# Patient Record
Sex: Male | Born: 1950 | Race: Black or African American | Hispanic: No | Smoking: Former smoker
Health system: Southern US, Community
[De-identification: ages and names within clinical notes are randomized; demographics above are authoritative.]

## PROBLEM LIST (undated history)

## (undated) DIAGNOSIS — F319 Bipolar disorder, unspecified: Secondary | ICD-10-CM

## (undated) DIAGNOSIS — I639 Cerebral infarction, unspecified: Secondary | ICD-10-CM

## (undated) DIAGNOSIS — IMO0001 Reserved for inherently not codable concepts without codable children: Secondary | ICD-10-CM

## (undated) DIAGNOSIS — Z5189 Encounter for other specified aftercare: Secondary | ICD-10-CM

## (undated) HISTORY — PX: KNEE ARTHROSCOPY: SHX127

## (undated) HISTORY — PX: CORONARY ARTERY BYPASS GRAFT: SHX141

---

## 2000-12-01 ENCOUNTER — Inpatient Hospital Stay (HOSPITAL_COMMUNITY): Admission: EM | Admit: 2000-12-01 | Discharge: 2000-12-07 | Payer: Self-pay

## 2000-12-05 ENCOUNTER — Encounter: Payer: Self-pay | Admitting: General Surgery

## 2000-12-06 ENCOUNTER — Encounter: Payer: Self-pay | Admitting: General Surgery

## 2000-12-07 ENCOUNTER — Inpatient Hospital Stay
Admission: RE | Admit: 2000-12-07 | Discharge: 2000-12-28 | Payer: Self-pay | Admitting: Physical Medicine & Rehabilitation

## 2000-12-08 ENCOUNTER — Encounter: Payer: Self-pay | Admitting: Physical Medicine and Rehabilitation

## 2000-12-17 ENCOUNTER — Encounter: Payer: Self-pay | Admitting: Physical Medicine & Rehabilitation

## 2000-12-17 ENCOUNTER — Ambulatory Visit (HOSPITAL_COMMUNITY)
Admission: RE | Admit: 2000-12-17 | Discharge: 2000-12-17 | Payer: Self-pay | Admitting: Physical Medicine & Rehabilitation

## 2000-12-24 ENCOUNTER — Ambulatory Visit (HOSPITAL_COMMUNITY)
Admission: RE | Admit: 2000-12-24 | Discharge: 2000-12-24 | Payer: Self-pay | Admitting: Physical Medicine & Rehabilitation

## 2000-12-24 ENCOUNTER — Encounter: Payer: Self-pay | Admitting: Specialist

## 2000-12-28 ENCOUNTER — Inpatient Hospital Stay (HOSPITAL_COMMUNITY)
Admission: RE | Admit: 2000-12-28 | Discharge: 2001-01-02 | Payer: Self-pay | Admitting: Physical Medicine & Rehabilitation

## 2001-02-13 ENCOUNTER — Encounter (HOSPITAL_COMMUNITY): Admission: RE | Admit: 2001-02-13 | Discharge: 2001-03-15 | Payer: Self-pay | Admitting: *Deleted

## 2001-03-18 ENCOUNTER — Encounter (HOSPITAL_COMMUNITY): Admission: RE | Admit: 2001-03-18 | Discharge: 2001-04-17 | Payer: Self-pay | Admitting: *Deleted

## 2001-04-19 ENCOUNTER — Encounter (HOSPITAL_COMMUNITY): Admission: RE | Admit: 2001-04-19 | Discharge: 2001-05-19 | Payer: Self-pay | Admitting: *Deleted

## 2001-05-20 ENCOUNTER — Encounter (HOSPITAL_COMMUNITY): Admission: RE | Admit: 2001-05-20 | Discharge: 2001-06-19 | Payer: Self-pay | Admitting: *Deleted

## 2002-11-23 ENCOUNTER — Emergency Department (HOSPITAL_COMMUNITY): Admission: EM | Admit: 2002-11-23 | Discharge: 2002-11-23 | Payer: Self-pay | Admitting: Emergency Medicine

## 2002-11-26 ENCOUNTER — Inpatient Hospital Stay (HOSPITAL_COMMUNITY): Admission: EM | Admit: 2002-11-26 | Discharge: 2002-12-05 | Payer: Self-pay | Admitting: Psychiatry

## 2003-01-24 ENCOUNTER — Emergency Department (HOSPITAL_COMMUNITY): Admission: EM | Admit: 2003-01-24 | Discharge: 2003-01-24 | Payer: Self-pay | Admitting: *Deleted

## 2003-07-28 ENCOUNTER — Ambulatory Visit (HOSPITAL_COMMUNITY): Admission: RE | Admit: 2003-07-28 | Discharge: 2003-07-28 | Payer: Self-pay | Admitting: General Surgery

## 2004-01-21 ENCOUNTER — Ambulatory Visit (HOSPITAL_COMMUNITY): Admission: RE | Admit: 2004-01-21 | Discharge: 2004-01-21 | Payer: Self-pay | Admitting: General Surgery

## 2004-04-03 ENCOUNTER — Emergency Department (HOSPITAL_COMMUNITY): Admission: EM | Admit: 2004-04-03 | Discharge: 2004-04-03 | Payer: Self-pay | Admitting: Emergency Medicine

## 2004-11-02 ENCOUNTER — Emergency Department (HOSPITAL_COMMUNITY): Admission: EM | Admit: 2004-11-02 | Discharge: 2004-11-02 | Payer: Self-pay | Admitting: Emergency Medicine

## 2004-11-03 ENCOUNTER — Emergency Department (HOSPITAL_COMMUNITY): Admission: EM | Admit: 2004-11-03 | Discharge: 2004-11-03 | Payer: Self-pay | Admitting: Emergency Medicine

## 2005-10-19 ENCOUNTER — Ambulatory Visit (HOSPITAL_COMMUNITY): Admission: RE | Admit: 2005-10-19 | Discharge: 2005-10-19 | Payer: Self-pay | Admitting: Family Medicine

## 2006-12-12 ENCOUNTER — Emergency Department (HOSPITAL_COMMUNITY): Admission: EM | Admit: 2006-12-12 | Discharge: 2006-12-12 | Payer: Self-pay | Admitting: Emergency Medicine

## 2007-04-25 ENCOUNTER — Emergency Department (HOSPITAL_COMMUNITY): Admission: EM | Admit: 2007-04-25 | Discharge: 2007-04-25 | Payer: Self-pay | Admitting: Emergency Medicine

## 2007-09-02 ENCOUNTER — Emergency Department (HOSPITAL_COMMUNITY): Admission: EM | Admit: 2007-09-02 | Discharge: 2007-09-02 | Payer: Self-pay | Admitting: Emergency Medicine

## 2007-09-20 ENCOUNTER — Emergency Department (HOSPITAL_COMMUNITY): Admission: EM | Admit: 2007-09-20 | Discharge: 2007-09-20 | Payer: Self-pay | Admitting: Emergency Medicine

## 2007-09-22 ENCOUNTER — Emergency Department (HOSPITAL_COMMUNITY): Admission: EM | Admit: 2007-09-22 | Discharge: 2007-09-22 | Payer: Self-pay | Admitting: Emergency Medicine

## 2007-10-03 ENCOUNTER — Emergency Department (HOSPITAL_COMMUNITY): Admission: EM | Admit: 2007-10-03 | Discharge: 2007-10-03 | Payer: Self-pay | Admitting: Emergency Medicine

## 2007-10-11 ENCOUNTER — Emergency Department (HOSPITAL_COMMUNITY): Admission: EM | Admit: 2007-10-11 | Discharge: 2007-10-11 | Payer: Self-pay | Admitting: Emergency Medicine

## 2007-10-15 ENCOUNTER — Emergency Department (HOSPITAL_COMMUNITY): Admission: EM | Admit: 2007-10-15 | Discharge: 2007-10-15 | Payer: Self-pay | Admitting: Emergency Medicine

## 2007-10-19 ENCOUNTER — Emergency Department (HOSPITAL_COMMUNITY): Admission: EM | Admit: 2007-10-19 | Discharge: 2007-10-19 | Payer: Self-pay | Admitting: Emergency Medicine

## 2007-10-23 ENCOUNTER — Emergency Department (HOSPITAL_COMMUNITY): Admission: EM | Admit: 2007-10-23 | Discharge: 2007-10-23 | Payer: Self-pay | Admitting: Emergency Medicine

## 2007-10-30 ENCOUNTER — Emergency Department (HOSPITAL_COMMUNITY): Admission: EM | Admit: 2007-10-30 | Discharge: 2007-10-30 | Payer: Self-pay | Admitting: Emergency Medicine

## 2007-11-17 ENCOUNTER — Emergency Department (HOSPITAL_COMMUNITY): Admission: EM | Admit: 2007-11-17 | Discharge: 2007-11-17 | Payer: Self-pay | Admitting: Emergency Medicine

## 2007-12-09 ENCOUNTER — Ambulatory Visit: Payer: Self-pay | Admitting: Family Medicine

## 2007-12-09 DIAGNOSIS — N318 Other neuromuscular dysfunction of bladder: Secondary | ICD-10-CM

## 2007-12-09 DIAGNOSIS — F17200 Nicotine dependence, unspecified, uncomplicated: Secondary | ICD-10-CM | POA: Insufficient documentation

## 2007-12-09 DIAGNOSIS — J029 Acute pharyngitis, unspecified: Secondary | ICD-10-CM

## 2007-12-09 DIAGNOSIS — F319 Bipolar disorder, unspecified: Secondary | ICD-10-CM

## 2007-12-09 DIAGNOSIS — I10 Essential (primary) hypertension: Secondary | ICD-10-CM | POA: Insufficient documentation

## 2007-12-09 DIAGNOSIS — E119 Type 2 diabetes mellitus without complications: Secondary | ICD-10-CM | POA: Insufficient documentation

## 2007-12-09 LAB — CONVERTED CEMR LAB
Glucose, Bld: 115 mg/dL
Hgb A1c MFr Bld: 5.4 %

## 2008-01-30 ENCOUNTER — Encounter (INDEPENDENT_AMBULATORY_CARE_PROVIDER_SITE_OTHER): Payer: Self-pay | Admitting: Family Medicine

## 2008-03-19 ENCOUNTER — Emergency Department (HOSPITAL_COMMUNITY): Admission: EM | Admit: 2008-03-19 | Discharge: 2008-03-19 | Payer: Self-pay | Admitting: Emergency Medicine

## 2008-04-10 ENCOUNTER — Ambulatory Visit: Payer: Self-pay | Admitting: Family Medicine

## 2008-04-10 DIAGNOSIS — A64 Unspecified sexually transmitted disease: Secondary | ICD-10-CM | POA: Insufficient documentation

## 2008-04-13 ENCOUNTER — Encounter (INDEPENDENT_AMBULATORY_CARE_PROVIDER_SITE_OTHER): Payer: Self-pay | Admitting: Family Medicine

## 2008-04-14 LAB — CONVERTED CEMR LAB
ALT: 11 U/L
AST: 20 U/L
Albumin: 4 g/dL
Alkaline Phosphatase: 66 U/L
BUN: 9 mg/dL
Basophils Absolute: 0 10*3/uL
Basophils Relative: 0 %
CO2: 23 meq/L
Calcium: 9.4 mg/dL
Chloride: 104 meq/L
Cholesterol: 208 mg/dL — ABNORMAL HIGH
Creatinine, Ser: 0.76 mg/dL
Creatinine, Urine: 329.2 mg/dL
Eosinophils Absolute: 0.2 10*3/uL
Eosinophils Relative: 4 %
GC Probe Amp, Urine: NEGATIVE
Glucose, Bld: 131 mg/dL — ABNORMAL HIGH
HCT: 44.5 %
HCV Ab: NEGATIVE
HDL: 77 mg/dL
Hemoglobin: 15.4 g/dL
Hep A IgM: NEGATIVE
Hep B C IgM: NEGATIVE
Hepatitis B Surface Ag: NEGATIVE
LDL Cholesterol: 117 mg/dL — ABNORMAL HIGH
Lymphocytes Relative: 29 %
Lymphs Abs: 1.6 10*3/uL
MCHC: 34.6 g/dL
MCV: 97.8 fL
Microalb Creat Ratio: 36.5 mg/g — ABNORMAL HIGH
Microalb, Ur: 12 mg/dL — ABNORMAL HIGH
Monocytes Absolute: 0.6 10*3/uL
Monocytes Relative: 10 %
Neutro Abs: 3.2 10*3/uL
Neutrophils Relative %: 57 %
PSA: 8.39 ng/mL — ABNORMAL HIGH
Platelets: 190 10*3/uL
Potassium: 4.1 meq/L
RBC: 4.55 M/uL
RDW: 13.2 %
Sodium: 140 meq/L
TSH: 1.149 u[IU]/mL
Total Bilirubin: 1.1 mg/dL
Total CHOL/HDL Ratio: 2.7
Total Protein: 8.2 g/dL
Triglycerides: 68 mg/dL
VLDL: 14 mg/dL
WBC: 5.7 10*3/uL

## 2008-04-18 ENCOUNTER — Emergency Department (HOSPITAL_COMMUNITY): Admission: EM | Admit: 2008-04-18 | Discharge: 2008-04-18 | Payer: Self-pay | Admitting: Emergency Medicine

## 2008-04-23 ENCOUNTER — Ambulatory Visit: Payer: Self-pay | Admitting: Family Medicine

## 2008-04-23 DIAGNOSIS — R972 Elevated prostate specific antigen [PSA]: Secondary | ICD-10-CM | POA: Insufficient documentation

## 2008-04-23 DIAGNOSIS — E785 Hyperlipidemia, unspecified: Secondary | ICD-10-CM | POA: Insufficient documentation

## 2008-04-23 DIAGNOSIS — R809 Proteinuria, unspecified: Secondary | ICD-10-CM

## 2008-04-23 LAB — CONVERTED CEMR LAB
HDL goal, serum: 40 mg/dL
LDL Goal: 100 mg/dL

## 2008-05-18 ENCOUNTER — Emergency Department (HOSPITAL_COMMUNITY): Admission: EM | Admit: 2008-05-18 | Discharge: 2008-05-18 | Payer: Self-pay | Admitting: Emergency Medicine

## 2008-06-04 ENCOUNTER — Encounter (INDEPENDENT_AMBULATORY_CARE_PROVIDER_SITE_OTHER): Payer: Self-pay | Admitting: Family Medicine

## 2008-06-14 ENCOUNTER — Emergency Department (HOSPITAL_COMMUNITY): Admission: EM | Admit: 2008-06-14 | Discharge: 2008-06-14 | Payer: Self-pay | Admitting: Emergency Medicine

## 2008-06-29 ENCOUNTER — Emergency Department (HOSPITAL_COMMUNITY): Admission: EM | Admit: 2008-06-29 | Discharge: 2008-06-29 | Payer: Self-pay | Admitting: Emergency Medicine

## 2008-07-01 ENCOUNTER — Emergency Department (HOSPITAL_COMMUNITY): Admission: EM | Admit: 2008-07-01 | Discharge: 2008-07-01 | Payer: Self-pay | Admitting: Emergency Medicine

## 2008-07-10 ENCOUNTER — Emergency Department (HOSPITAL_COMMUNITY): Admission: EM | Admit: 2008-07-10 | Discharge: 2008-07-10 | Payer: Self-pay | Admitting: Emergency Medicine

## 2008-07-19 ENCOUNTER — Emergency Department (HOSPITAL_COMMUNITY): Admission: EM | Admit: 2008-07-19 | Discharge: 2008-07-19 | Payer: Self-pay | Admitting: Emergency Medicine

## 2008-07-21 ENCOUNTER — Emergency Department (HOSPITAL_COMMUNITY): Admission: EM | Admit: 2008-07-21 | Discharge: 2008-07-21 | Payer: Self-pay | Admitting: Emergency Medicine

## 2008-07-31 ENCOUNTER — Emergency Department (HOSPITAL_COMMUNITY): Admission: EM | Admit: 2008-07-31 | Discharge: 2008-07-31 | Payer: Self-pay | Admitting: Emergency Medicine

## 2008-08-03 ENCOUNTER — Inpatient Hospital Stay (HOSPITAL_COMMUNITY): Admission: EM | Admit: 2008-08-03 | Discharge: 2008-08-07 | Payer: Self-pay | Admitting: Psychiatry

## 2008-08-03 ENCOUNTER — Emergency Department (HOSPITAL_COMMUNITY): Admission: EM | Admit: 2008-08-03 | Discharge: 2008-08-03 | Payer: Self-pay | Admitting: Emergency Medicine

## 2008-08-03 ENCOUNTER — Ambulatory Visit: Payer: Self-pay | Admitting: Psychiatry

## 2008-08-19 ENCOUNTER — Ambulatory Visit: Payer: Self-pay | Admitting: Family Medicine

## 2008-08-19 LAB — CONVERTED CEMR LAB: Glucose, Bld: 141 mg/dL

## 2008-08-26 ENCOUNTER — Telehealth (INDEPENDENT_AMBULATORY_CARE_PROVIDER_SITE_OTHER): Payer: Self-pay | Admitting: *Deleted

## 2008-09-24 ENCOUNTER — Encounter (INDEPENDENT_AMBULATORY_CARE_PROVIDER_SITE_OTHER): Payer: Self-pay | Admitting: Family Medicine

## 2008-10-20 ENCOUNTER — Ambulatory Visit: Payer: Self-pay | Admitting: Family Medicine

## 2008-10-20 DIAGNOSIS — M549 Dorsalgia, unspecified: Secondary | ICD-10-CM | POA: Insufficient documentation

## 2008-10-20 DIAGNOSIS — M25519 Pain in unspecified shoulder: Secondary | ICD-10-CM | POA: Insufficient documentation

## 2008-10-20 DIAGNOSIS — M542 Cervicalgia: Secondary | ICD-10-CM | POA: Insufficient documentation

## 2008-11-03 ENCOUNTER — Ambulatory Visit: Payer: Self-pay | Admitting: Family Medicine

## 2008-11-03 ENCOUNTER — Telehealth (INDEPENDENT_AMBULATORY_CARE_PROVIDER_SITE_OTHER): Payer: Self-pay | Admitting: Family Medicine

## 2008-11-04 ENCOUNTER — Encounter (INDEPENDENT_AMBULATORY_CARE_PROVIDER_SITE_OTHER): Payer: Self-pay | Admitting: Family Medicine

## 2008-11-10 ENCOUNTER — Encounter (INDEPENDENT_AMBULATORY_CARE_PROVIDER_SITE_OTHER): Payer: Self-pay | Admitting: *Deleted

## 2008-11-10 LAB — CONVERTED CEMR LAB
AST: 30 units/L (ref 0–37)
Alkaline Phosphatase: 63 units/L (ref 39–117)
Glucose, Bld: 126 mg/dL — ABNORMAL HIGH (ref 70–99)
HDL: 86 mg/dL (ref >39)
LDL Cholesterol: 95 mg/dL (ref 0–99)
PSA: 4.18 ng/mL — ABNORMAL HIGH (ref 0.10–4.00)
Potassium: 4.6 meq/L (ref 3.5–5.3)
Sodium: 137 meq/L (ref 135–145)
Total Bilirubin: 0.9 mg/dL (ref 0.3–1.2)
Total Protein: 7.7 g/dL (ref 6.0–8.3)
Triglycerides: 47 mg/dL (ref <150)
VLDL: 9 mg/dL (ref 0–40)

## 2008-11-20 ENCOUNTER — Encounter (INDEPENDENT_AMBULATORY_CARE_PROVIDER_SITE_OTHER): Payer: Self-pay | Admitting: Family Medicine

## 2008-12-15 ENCOUNTER — Encounter (INDEPENDENT_AMBULATORY_CARE_PROVIDER_SITE_OTHER): Payer: Self-pay | Admitting: Family Medicine

## 2010-05-02 ENCOUNTER — Inpatient Hospital Stay (HOSPITAL_COMMUNITY): Admission: EM | Admit: 2010-05-02 | Discharge: 2010-05-04 | Payer: Self-pay | Admitting: Emergency Medicine

## 2010-05-03 ENCOUNTER — Ambulatory Visit: Payer: Self-pay | Admitting: Physical Medicine & Rehabilitation

## 2010-05-03 ENCOUNTER — Encounter (INDEPENDENT_AMBULATORY_CARE_PROVIDER_SITE_OTHER): Payer: Self-pay | Admitting: Internal Medicine

## 2010-05-04 ENCOUNTER — Encounter (INDEPENDENT_AMBULATORY_CARE_PROVIDER_SITE_OTHER): Payer: Self-pay | Admitting: Internal Medicine

## 2010-05-04 ENCOUNTER — Inpatient Hospital Stay (HOSPITAL_COMMUNITY)
Admission: RE | Admit: 2010-05-04 | Discharge: 2010-05-11 | Payer: Self-pay | Admitting: Physical Medicine & Rehabilitation

## 2010-05-04 ENCOUNTER — Ambulatory Visit: Payer: Self-pay | Admitting: Physical Medicine & Rehabilitation

## 2010-06-14 ENCOUNTER — Encounter
Admission: RE | Admit: 2010-06-14 | Discharge: 2010-06-14 | Payer: Self-pay | Source: Home / Self Care | Attending: Physical Medicine & Rehabilitation | Admitting: Physical Medicine & Rehabilitation

## 2010-12-01 LAB — COMPREHENSIVE METABOLIC PANEL
AST: 17 U/L (ref 0–37)
Albumin: 3.3 g/dL — ABNORMAL LOW (ref 3.5–5.2)
Albumin: 3.4 g/dL — ABNORMAL LOW (ref 3.5–5.2)
BUN: 12 mg/dL (ref 6–23)
BUN: 9 mg/dL (ref 6–23)
CO2: 25 mEq/L (ref 19–32)
Calcium: 8.6 mg/dL (ref 8.4–10.5)
Calcium: 8.9 mg/dL (ref 8.4–10.5)
Calcium: 9.1 mg/dL (ref 8.4–10.5)
Creatinine, Ser: 0.74 mg/dL (ref 0.4–1.5)
Creatinine, Ser: 0.74 mg/dL (ref 0.4–1.5)
GFR calc Af Amer: >60 mL/min (ref >60)
GFR calc Af Amer: >60 mL/min (ref >60)
Glucose, Bld: 100 mg/dL — ABNORMAL HIGH (ref 70–99)
Glucose, Bld: 84 mg/dL (ref 70–99)
Potassium: 3.4 mEq/L — ABNORMAL LOW (ref 3.5–5.1)
Sodium: 138 mEq/L (ref 135–145)
Sodium: 139 mEq/L (ref 135–145)
Total Bilirubin: 0.6 mg/dL (ref 0.3–1.2)
Total Protein: 6.4 g/dL (ref 6.0–8.3)
Total Protein: 7 g/dL (ref 6.0–8.3)

## 2010-12-01 LAB — CARDIOLIPIN ANTIBODIES, IGG, IGM, IGA
Anticardiolipin IgG: 2 GPL U/mL — ABNORMAL LOW (ref <23)
Anticardiolipin IgM: 1 MPL U/mL — ABNORMAL LOW (ref <11)

## 2010-12-01 LAB — LUPUS ANTICOAGULANT PANEL: PTTLA 4:1 Mix: 45.6 secs (ref 30.0–45.6)

## 2010-12-01 LAB — MRSA PCR SCREENING: MRSA by PCR: NEGATIVE

## 2010-12-01 LAB — URINALYSIS, MICROSCOPIC ONLY
Bilirubin Urine: NEGATIVE
Leukocytes, UA: NEGATIVE
Nitrite: NEGATIVE
Specific Gravity, Urine: 1.005 (ref 1.005–1.030)
pH: 7 (ref 5.0–8.0)

## 2010-12-01 LAB — SEDIMENTATION RATE: Sed Rate: 20 mm/hr — ABNORMAL HIGH (ref 0–16)

## 2010-12-01 LAB — GLUCOSE, CAPILLARY
Glucose-Capillary: 100 mg/dL — ABNORMAL HIGH (ref 70–99)
Glucose-Capillary: 101 mg/dL — ABNORMAL HIGH (ref 70–99)
Glucose-Capillary: 103 mg/dL — ABNORMAL HIGH (ref 70–99)
Glucose-Capillary: 106 mg/dL — ABNORMAL HIGH (ref 70–99)
Glucose-Capillary: 108 mg/dL — ABNORMAL HIGH (ref 70–99)
Glucose-Capillary: 119 mg/dL — ABNORMAL HIGH (ref 70–99)
Glucose-Capillary: 120 mg/dL — ABNORMAL HIGH (ref 70–99)
Glucose-Capillary: 122 mg/dL — ABNORMAL HIGH (ref 70–99)
Glucose-Capillary: 152 mg/dL — ABNORMAL HIGH (ref 70–99)
Glucose-Capillary: 160 mg/dL — ABNORMAL HIGH (ref 70–99)
Glucose-Capillary: 173 mg/dL — ABNORMAL HIGH (ref 70–99)
Glucose-Capillary: 91 mg/dL (ref 70–99)
Glucose-Capillary: 94 mg/dL (ref 70–99)
Glucose-Capillary: 98 mg/dL (ref 70–99)
Glucose-Capillary: 99 mg/dL (ref 70–99)

## 2010-12-01 LAB — DIFFERENTIAL
Basophils Absolute: 0 10*3/uL (ref 0.0–0.1)
Eosinophils Relative: 6 % — ABNORMAL HIGH (ref 0–5)
Lymphocytes Relative: 39 % (ref 12–46)
Lymphocytes Relative: 42 % (ref 12–46)
Lymphs Abs: 2.4 10*3/uL (ref 0.7–4.0)
Lymphs Abs: 2.8 10*3/uL (ref 0.7–4.0)
Monocytes Absolute: 0.5 10*3/uL (ref 0.1–1.0)
Monocytes Absolute: 0.7 10*3/uL (ref 0.1–1.0)
Monocytes Relative: 11 % (ref 3–12)
Neutro Abs: 2.9 10*3/uL (ref 1.7–7.7)

## 2010-12-01 LAB — URINALYSIS, ROUTINE W REFLEX MICROSCOPIC
Glucose, UA: NEGATIVE mg/dL
Hgb urine dipstick: NEGATIVE
Protein, ur: NEGATIVE mg/dL

## 2010-12-01 LAB — CK TOTAL AND CKMB (NOT AT ARMC)
CK, MB: 1.2 ng/mL (ref 0.3–4.0)
Relative Index: 0.9 (ref 0.0–2.5)

## 2010-12-01 LAB — CBC
HCT: 37 % — ABNORMAL LOW (ref 39.0–52.0)
HCT: 38 % — ABNORMAL LOW (ref 39.0–52.0)
Hemoglobin: 12.9 g/dL — ABNORMAL LOW (ref 13.0–17.0)
MCH: 31.1 pg (ref 26.0–34.0)
MCHC: 33.5 g/dL (ref 30.0–36.0)
MCHC: 34.2 g/dL (ref 30.0–36.0)
MCV: 91.8 fL (ref 78.0–100.0)
Platelets: 124 10*3/uL — ABNORMAL LOW (ref 150–400)
RBC: 4.14 MIL/uL — ABNORMAL LOW (ref 4.22–5.81)
RBC: 4.18 MIL/uL — ABNORMAL LOW (ref 4.22–5.81)
RDW: 12.8 % (ref 11.5–15.5)
WBC: 6.6 10*3/uL (ref 4.0–10.5)

## 2010-12-01 LAB — POCT I-STAT, CHEM 8
BUN: 13 mg/dL (ref 6–23)
Calcium, Ion: 1.1 mmol/L — ABNORMAL LOW (ref 1.12–1.32)
Creatinine, Ser: 0.9 mg/dL (ref 0.4–1.5)
Glucose, Bld: 82 mg/dL (ref 70–99)
TCO2: 23 mmol/L (ref 0–100)

## 2010-12-01 LAB — LIPID PANEL
Cholesterol: 206 mg/dL — ABNORMAL HIGH (ref 0–200)
LDL Cholesterol: 134 mg/dL — ABNORMAL HIGH (ref 0–99)
Triglycerides: 65 mg/dL (ref <150)

## 2010-12-01 LAB — FACTOR 5 LEIDEN

## 2010-12-01 LAB — ANTI-NEUTROPHIL ANTIBODY

## 2010-12-01 LAB — RPR: RPR Ser Ql: NONREACTIVE

## 2010-12-01 LAB — HEMOGLOBIN A1C: Hgb A1c MFr Bld: 5.8 % — ABNORMAL HIGH (ref <5.7)

## 2010-12-01 LAB — TSH: TSH: 1.187 u[IU]/mL (ref 0.350–4.500)

## 2010-12-01 LAB — BETA-2-GLYCOPROTEIN I ABS, IGG/M/A: Beta-2 Glyco I IgG: 0 G Units (ref <20)

## 2010-12-01 LAB — APTT: aPTT: 33 seconds (ref 24–37)

## 2010-12-01 LAB — URINE CULTURE

## 2010-12-01 LAB — MAGNESIUM: Magnesium: 1.9 mg/dL (ref 1.5–2.5)

## 2010-12-01 LAB — PROTEIN S ACTIVITY: Protein S Activity: 94 % (ref 69–129)

## 2010-12-01 LAB — ANTITHROMBIN III: AntiThromb III Func: 77 % (ref 76–126)

## 2010-12-01 LAB — PROTIME-INR
INR: 1.08 (ref 0.00–1.49)
Prothrombin Time: 14.2 seconds (ref 11.6–15.2)

## 2011-02-03 NOTE — Op Note (Signed)
   NAME:  [name] [name], [name] [name]                         ACCOUNT NO.:  [id number]   MEDICAL RECORD NO.:  [id number]                   PATIENT TYPE:  AMB   LOCATION:  DAY                                  FACILITY:  [facility]   PHYSICIAN:  Jerolyn Shin C. Katrinka Blazing, M.D.                DATE OF BIRTH:  October 28, 1950   DATE OF PROCEDURE:  DATE OF DISCHARGE:                                 OPERATIVE REPORT   PREOPERATIVE DIAGNOSIS:  Balanitis/phimosis.   POSTOPERATIVE DIAGNOSIS:  Balanitis/phimosis.   PROCEDURE:  Circumcision.   SURGEON:  Dirk Dress. Katrinka Blazing, M.D.   DESCRIPTION:  Under general anesthesia the patient's genitalia were prepped  and draped in a sterile field.  A circumferential incision was made about 2  cm proximal to the corona.  The foreskin was separated with electrocautery.  About 4 cm distal to this another circumferential incision was made.  The  incision was extended down through the skin with electrocautery.  The skin  in the subcutaneous plane was dissected, divided and then excised without  difficulty.  Reanastomosis was carried out using running locking 2-0  chromic.  A dressing was placed.  The patient was awakened from anesthesia  uneventfully, transferred to a bed and taken to the postoperative recovery  area.      ___________________________________________                                            Dirk Dress. Katrinka Blazing, M.D.   LCS/MEDQ  D:  07/28/2003  T:  07/28/2003  Job:  [id number]

## 2011-02-03 NOTE — Op Note (Signed)
NAME:  [name] [name], [name] [name]                         ACCOUNT NO.:  [id number]   MEDICAL RECORD NO.:  [id number]                   PATIENT TYPE:  AMB   LOCATION:  DAY                                  FACILITY:  [facility]   PHYSICIAN:  Jerolyn Shin C. Katrinka Blazing, M.D.                DATE OF BIRTH:  [birth year]   DATE OF PROCEDURE:  01/21/2004  DATE OF DISCHARGE:                                 OPERATIVE REPORT   INDICATIONS:  A 60 year old male with history of an enlarging painful mass  of the right upper shoulder.  He is being scheduled to have the mass removed  under local anesthesia.   PREOPERATIVE DIAGNOSIS:  Soft tissue mass, right upper shoulder.   POSTOPERATIVE DIAGNOSIS:  Soft tissue mass, right upper shoulder, 4 cm.   OPERATION/PROCEDURE:  Excision of soft tissue mass, right shoulder, 4 cm.   SURGEON:  Dirk Dress. Katrinka Blazing, M.D.   DESCRIPTION OF PROCEDURE:  The procedure was done in the minor procedure  room.  The patient's right shoulder was prepped and draped.  Local  infiltration with 1% Xylocaine was carried out.  An elliptical incision was  made over the mass and using the skin as a traction source, the mass was  entirely excised.  It appeared to be encapsulated.  Once it was excised,  hemostasis was achieved with electrocautery.  The subcutaneous tissue was  closed with 2-0 and 3-0 Monocryl.  Skin was closed with interrupted 4-0  Prolene.  The patient tolerated the procedure well.  Dressing was placed.   He was advised to have followup in the office in 10 days for suture removal.      ___________________________________________                                            Dirk Dress. Katrinka Blazing, M.D.   LCS/MEDQ  D:  01/21/2004  T:  01/21/2004  Job:  [id number]
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
## 2011-02-03 NOTE — Discharge Summary (Signed)
NAME:  [name] [name], [name] [name] NO.:  [id number]   MEDICAL RECORD NO.:  [id number]          PATIENT TYPE:  IPS   LOCATION:  0404                          FACILITY:  [facility]   PHYSICIAN:  Anselm Jungling, MD  DATE OF BIRTH:  Jan 24, 1951   DATE OF ADMISSION:  08/03/2008  DATE OF DISCHARGE:  08/07/2008                               DISCHARGE SUMMARY   IDENTIFYING DATA AND REASON FOR ADMISSION:  This was an inpatient  psychiatric admission for [name] [name], a 60 year old divorced African  American male who was admitted on an involuntary basis.  He came to [place]  with a history of bipolar disorder and substance abuse.  He had been  brought in by the [employer].  This was his third [facility]  admission, but his last one had been several years ago.  He indicated  that he had been under the care of a Dr. Thomasena Edis, and his most recent  medications have included Seroquel, but because of his thought  disturbance he was unable to give accurate history.  Please refer to the  admission note for further details pertaining to the symptoms,  circumstances and history that led to his hospitalization.  He was given  an initial Axis I diagnosis of bipolar disorder, currently manic with  psychotic features, polysubstance abuse and rule out substance induced  psychosis.   MEDICAL AND LABORATORY:  The patient was medically and physically  assessed by the psychiatric nurse practitioner.  He came to [place] with a  history of no active medical problems.  There were no significant  medical issues during his hospital stay.   HOSPITAL COURSE:  The patient was admitted to the adult inpatient  psychiatric service.  He presented as a well-nourished, normally-  developed adult male who was clearly in a manic phase, quite animated,  pressured, with tangential thinking.  He was generally pleasant, but at  times was a bit labile initially and sometimes loud.  However, as he  responded to his regimen of Depakote  and Seroquel his mood stabilized  and his thinking became more realistic and better organized.   He was a good participant in the treatment program and cooperative with  taking medication and all other aspects of treatment.   As he cleared, he did verbalize some complaints regarding his urinary  function, mainly some urinary frequency and mild incontinence.  It  appeared that these were longstanding issues and although they warranted  later evaluation by a urological specialist, did not need any immediate  attention.   The patient worked closely with case management towards an aftercare  plan.  He allowed contact with some of his family.   On the sixth hospital day the patient was in good spirits and indicated  that he felt ready for discharge.  He agreed to the following aftercare  plan.   AFTERCARE:  The patient was to follow up at [facility] with  an appointment on August 11, 2008, at 8 a.m.   DISCHARGE MEDICATIONS:  1. Depakote 1000 mg q.h.s..  2. Seroquel XR 300 mg q.h.s.  DISCHARGE DIAGNOSES:  AXIS I:  Bipolar disorder, most recently manic  with psychotic features, resolving and polysubstance abuse.  AXIS II:  Deferred.  AXIS III:  No acute or chronic illnesses.  AXIS IV:  Stressors severe.  AXIS V:  GAF on discharge 55.      Anselm Jungling, MD  Electronically Signed     SPB/MEDQ  D:  08/20/2008  T:  08/20/2008  Job:  [id number]
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
## 2011-02-03 NOTE — Discharge Summary (Signed)
NAME:  [name] [name], [name] [name]                         ACCOUNT NO.:  [id number]   MEDICAL RECORD NO.:  [id number]                   PATIENT TYPE:  IPS   LOCATION:  0406                                 FACILITY:  [facility]   PHYSICIAN:  Hipolito Bayley, M.D.               DATE OF BIRTH:  09/30/1950   DATE OF ADMISSION:  11/26/2002  DATE OF DISCHARGE:  12/05/2002                                 DISCHARGE SUMMARY   INTRODUCTION:  [name] [name] is a 60 year old black male, married, who was  admitted on involuntary papers.  At the time of admission he started feeling  agitated, making threatening statements about killing his wife, stating that  she has had an affair with the neighbor.  In the past, the patient had a  history of alcohol drinking, but most recently was clean.  In the past, he  had been treated for bipolar disorder and manic episodes in the past, but  later doing well.  Initial impression was bipolar disorder, manic type.   HOSPITAL COURSE:  After being admitted to the ward, the patient was placed  on special observation.  He was started on Depakote with the dose gradually  increased, and Zydis.  Through the course of treatment, the dose of  medication was optimized and Seroquel was added due to insomnia.  He started  gradually improving.  At first with no insight into his condition,  argumentative and verbally threatening.  At the end of hospitalization he  became, calm, pleasant, and cooperative with the treatment.  On March 18, he  was allowed to sign voluntary papers.   MEDICAL PROBLEMS:  The patient suffers from toothache and was started on  Augmentin due to tooth infection.  Because of elevation of blood pressure,  Toprol XL 50 mg daily was started after medical consultation accomplished.  Review of blood work showed CBC with differentiation normal, chemistry17  with exception of elevation of blood sugar.  HgA1C was normal and consultant  thought the patient's tendency toward  diabetes would be managed with diet.  Valporic acid level of March 16 was 64.2.  Thyroid hormones were normal,  with slight elevation of T3 uptake 37.1, with normal up to 37.  Urine drug  screen was negative.  Urinalysis was normal with exception of 30 mg of  protein per dl.  The patient's blood pressure had normalized prior to  discharge.  On March 17, a meeting took place between the patient and his  wife.  The patient's wife wanted separation but the relationship seemed to  be amicable and they were supposed to work for union.  The patient was  supposed to live after discharge with his brother.  As he promised safety,  denied any suicidal or homicidal thoughts and felt that he worked towards  reunion but will accept if things between him and his wife will not improve.   DISCHARGE DIAGNOSES:   AXIS  I:  1. Bipolar disorder, manic.  2. Alcohol abuse in remission.   AXIS II:  No diagnosis.   AXIS III:  Hypertension, obesity, borderline elevation of blood sugar.   AXIS IV:  Moderate to severe problems related to financial problems and  legal problems.   AXIS V:  Global assessment of function upon admission 21, maximum for past  year 55, upon discharge 55.   DISCHARGE MEDICATIONS:  1. Depakote ER 1000 mg in the morning and 1500 mg at bedtime.  2. Augmentin 875 mg twice a day x7 days and see the dentist for treatment of     dental abscesses.  3. Zyprexa 10 mg 1/2 at noon and 2 at bedtime.  4. Toprol XL 50 mg daily.  5. Darvocet N 100 4-6 hours as needed for dental pain.  6. Seroquel 100 mg at bedtime.   DISCHARGE RECOMMENDATIONS:  The patient should stay on low fat, low sugar  diet and see family doctor because of elevated blood pressure and sugar  problems.  He should call or come to emergency department if problems with  medication or recurrence of symptoms.  He has an appointment with Dr.  Lourdes Sledge on March 31 at 11 a.m. for psychotic follow-up.  The patient  understood  instructions and in good condition was discharged home in care of  his family.                                                 Hipolito Bayley, M.D.    JS/MEDQ  D:  01/20/2003  T:  01/21/2003  Job:  [id number]
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
## 2011-02-03 NOTE — Op Note (Signed)
[place]. [facility]  Patient:    MC, BLOODWORTH                      MRN: [id number] Proc. Date: 12/02/00 Adm. Date:  [id number] Attending:  Trauma, Md                           Operative Report  PREOPERATIVE DIAGNOSIS: 1. Left type 2 open comminuted displaced, tibia and fibula diaphyseal    fracture. 2. Left severely comminuted proximal and midshaft femur fracture including    intertrochanteric fracture with basocervical extension. 3. Left shoulder anterior dislocation with greater tuberosity fracture.  POSTOPERATIVE DIAGNOSIS: 1. Left type 2 open comminuted displaced, tibia and fibula diaphyseal    fracture. 2. Left severely comminuted proximal and midshaft femur fracture including    intertrochanteric fracture with basocervical extension. 3. Left shoulder anterior dislocation with greater tuberosity fracture. 4. Lateral tibial plateau fracture.  OPERATION PERFORMED: 1. Open reduction internal fixation intramedullary nailing, left femur    fracture with Smith Nephew intramedullary nail with compression screw    static interlock. 2. Irrigation and debridement of open tibial fracture. 3. Intramedullary nailing of tibia with Smith Nephew titanium tibial nail,    static interlock. 4. Open reduction internal fixation of lateral tibial plateau fracture on    left. 5. Closed reduction of anterior shoulder fracture dislocation.  SURGEON:  R. Valma Cava, M.D.  ASSISTANT:  Della Goo, P.A.  ANESTHESIA:  General.  ESTIMATED BLOOD LOSS:  1200 cc.  DRAINS:  Penrose drains in open wounds on left leg.  One medium Hemovac into the left hip.  BLOOD REPLACEMENT:  4 units of packed red blood cells.  DESCRIPTION OF PROCEDURE:  The patient was taken to the operating room and placed in supine position with general anesthesia.  A right A-line was placed by Dr. Noreene Larsson as was a right central line.  He was then placed on the fracture table, properly  padded and bumped and we were very cautious with him.  Foley catheter was placed utilizing sterile technique by the OR circulating nurse. Attention was first directed to the left shoulder where we underwent a closed reduction of dislocation.  C-arm was utilized to confirm anatomic reduction of fracture and reduction of joint.  Placed in shoulder immobilizer.  Preoperative antibiotics had been given.  The left lower extremity was next addressed.  The tibia was then prepped with Betadine prep and scrub, draped in sterile fashion.  On the medial side of the leg there was a 3 to 3.5 cm open area on the medial aspect of the distal leg.  Just distal to the fracture consistent with the area of the open fracture.  There was another area open proximal and medial.  All the skin edges were debrided utilizing pulsatile lavage and copious irrigation of the wound and debridement of the area. Following this, attention was directed to intramedullary nailing.  A small incision was made in the skin and subcutaneous tissues.  Medial paraparellar incision was made.  The starting point for the tibial nail was identified.  A guide pin was placed and checked with the C-arm, overreamed with a step reamer and a guide wire was passed down and across the fracture site to reduce the fracture.  I sequentially reamed and then we chose appropriate size Smith and Nephew titanium nail and placed  it up in the guide wire and removed the guide  wire.  A proximal interlocking screw was placed. The wounds were copiously irrigated.  Distally, the two interlocking screws were placed utilizing fluoroscopic assitance and standard technique.  These were done through stab wounds.  The tibia was then found to be stable and with C-arm x-rays we found that there was a lateral tibial plateau fracture which was not identifiable before surgery secondary to positioning.  This was obviously a traumatic fracture and was by no means related  to the surgical procedure.  It could be reduced anatomically.  Following this, a lateral stab wound was made.  It was reduced and a Synthes interfragmentary screw with soft tissue washer was placed across the fracture site, giving anatomic reduction and stable fixation.  All wounds were copiously irrigated at this point in time.  The surgical wounds were closed in sequential layers with Vicryl and skin closed with staples.  On the traumatic wounds, Penrose drains were placed and they were left open and on the distal wound that was larger, nylon sutures were placed for later [facility] closure.  Sterile dressings were applied to the lower extremity, sterile dressings and Ace wraps.  Circulation of the foot and ankle remained unchanged.  He had diminished pulses but they were present and also palpable and also easily obtainable with Doppler.  At this time a transverse femoral pin was placed with longitudinal traction. I did not feel comfortable putting traction on the tibia with the type of fracture that this gentleman had.  Therefore, a femoral pin was placed.  He was then placed into longitudinal skeletal traction, letting the left leg be dependent.  At this point in time, we found that we had a severely comminuted proximal femur fracture.  There was an intertrochanteric communited fracture with an area of basilar cervical extension but it exited out through the greater tuberosity and fractured across the top of the greater trochanter and then exited above the lesser trochanter.  In addition, there was comminution of the proximal femoral shaft going distally.  Very complex fracture pattern. At this point in time, we reduced him as well as possible with the C-arm and fracture table.  Prepped with DuraPrep and draped in sterile fashion.  A 5 cm incision was made just proximal to the greater tuberosity.  The gluteus maximus fascia was split.  We then used a Smith and Nephew hip compression nail.  A  guide pin was placed into the greater trochanter region even though there was comminution, checked in AP and lateral planes, made adjustments.  Then the reamer was used proximally.  We then chose the appropriate sized nail and then placed it down the fracture and then utilizing the C-arm reduction maneuvers across the fracture site giving excellent reduction of the shaft fracture.  Attention was then directed proximally to the proximal fracture area for the compression screw insertion.  This required quite some time. There wsas marked comminution and basically required an opening of the capsule.  The incision was carried distally through the skin and subcutaneous tissues, fascia lata.  Vastus lateralis was rotated anteriorly.  The anterior capsule was lifted up and bone hook was placed on the medial aspect of the femoral neck and then it was reduced as well as possible trying to preserve the posterior blood supply as much as possible.  We then placed a compression screw utilizing the C-arm in standard technique.  We then placed a 110 mm compression screw and then compressed it and locked it from above.  Attention was  then directed distally where the two interlocking screws were placed utilizing standard technique with fluoroscopy with image intensification.  All wounds were copiously irrigated at this point in time.  The fascia lata was closed with Vicryl suture.  Subcutaneous Vicryl, skin closed with staples. Also we placed a Hemovac drain next to the proximal fracture to try to avoid a heterotopic ____________ later and avoid hemotoma formation.  The skin was closed with staples.  Sterile dressings applied.  The traction pin was then removed and a sterile dressing applied.  The knee was examined.  It seemed to be stable to varus and valgus stress, had no anterior-posterior instability that could be detected.  He was placed in a knee immobilizer.  Distal pulses were improved at this point  in time, were now palpable.  Leg lengths appeared to be symmetric.  During the procedure, the patient had been given more antibiotics intravenously.  He had been kept volume replaced and he was hemodynamically stable.  He was then awakened and extubated and taken to the operating room ____________  Sponge and needle count were correct. DD:  12/02/00 TD:  12/03/00 Job: [id number]

## 2011-06-09 LAB — DIFFERENTIAL
Basophils Relative: 0
Eosinophils Relative: 5
Lymphs Abs: 1.9
Monocytes Absolute: 0.6
Neutro Abs: 4.5

## 2011-06-09 LAB — CBC
HCT: 38.4 — ABNORMAL LOW
Hemoglobin: 13
MCV: 93.2
Platelets: 205
WBC: 7.4

## 2011-06-09 LAB — BASIC METABOLIC PANEL
BUN: 14
Chloride: 106
Potassium: 4.1

## 2011-06-09 LAB — RAPID URINE DRUG SCREEN, HOSP PERFORMED
Cocaine: NOT DETECTED
Opiates: NOT DETECTED

## 2011-06-09 LAB — ETHANOL: Alcohol, Ethyl (B): <5

## 2011-06-13 ENCOUNTER — Encounter: Payer: Self-pay | Admitting: Emergency Medicine

## 2011-06-13 ENCOUNTER — Emergency Department (HOSPITAL_COMMUNITY)
Admission: EM | Admit: 2011-06-13 | Discharge: 2011-06-13 | Disposition: A | Discharge status: Other Acute Inpatient Hospital | Payer: Medicare Other | Source: Home / Self Care

## 2011-06-13 DIAGNOSIS — R45851 Suicidal ideations: Secondary | ICD-10-CM | POA: Insufficient documentation

## 2011-06-13 DIAGNOSIS — F29 Unspecified psychosis not due to a substance or known physiological condition: Secondary | ICD-10-CM | POA: Insufficient documentation

## 2011-06-13 DIAGNOSIS — Z951 Presence of aortocoronary bypass graft: Secondary | ICD-10-CM | POA: Insufficient documentation

## 2011-06-13 DIAGNOSIS — Z8673 Personal history of transient ischemic attack (TIA), and cerebral infarction without residual deficits: Secondary | ICD-10-CM | POA: Insufficient documentation

## 2011-06-13 DIAGNOSIS — F329 Major depressive disorder, single episode, unspecified: Secondary | ICD-10-CM | POA: Insufficient documentation

## 2011-06-13 DIAGNOSIS — F3289 Other specified depressive episodes: Secondary | ICD-10-CM | POA: Insufficient documentation

## 2011-06-13 HISTORY — DX: Encounter for other specified aftercare: Z51.89

## 2011-06-13 HISTORY — DX: Cerebral infarction, unspecified: I63.9

## 2011-06-13 HISTORY — DX: Reserved for inherently not codable concepts without codable children: IMO0001

## 2011-06-13 LAB — CBC
MCH: 30.8 pg (ref 26.0–34.0)
MCHC: 33.6 g/dL (ref 30.0–36.0)
MCV: 91.4 fL (ref 78.0–100.0)
Platelets: 145 10*3/uL — ABNORMAL LOW (ref 150–400)
RDW: 12.5 % (ref 11.5–15.5)
WBC: 5.4 10*3/uL (ref 4.0–10.5)

## 2011-06-13 LAB — ETHANOL: Alcohol, Ethyl (B): <11 mg/dL (ref 0–11)

## 2011-06-13 LAB — COMPREHENSIVE METABOLIC PANEL
AST: 15 U/L (ref 0–37)
Albumin: 3.7 g/dL (ref 3.5–5.2)
BUN: 10 mg/dL (ref 6–23)
Calcium: 9.6 mg/dL (ref 8.4–10.5)
Creatinine, Ser: 0.87 mg/dL (ref 0.50–1.35)

## 2011-06-13 LAB — RAPID URINE DRUG SCREEN, HOSP PERFORMED
Benzodiazepines: NOT DETECTED
Cocaine: NOT DETECTED

## 2011-06-13 NOTE — ED Notes (Signed)
Pt and family informed of pending transport and admission to [facility]. Both verbalized understanding and approved of unit choice.

## 2011-06-13 NOTE — ED Notes (Signed)
Sitter at side with pt.

## 2011-06-13 NOTE — ED Notes (Signed)
Pt states that his good friend passed away 2 weeks ago and he has been thinking about it a lot and has gotten depressed and started thinking about killing himself. Pt calm and cooperative. Alert and oriented x 3. States that he feels like he needs some help for his depression. Family at bedside.

## 2011-06-13 NOTE — ED Notes (Signed)
Pt states has had suicidal thoughts for one month. Pt states had plan to take a bunch of pills or wants to hang himself. Pt states no homicidal thoughts.

## 2011-06-13 NOTE — ED Notes (Addendum)
Pt calm talking with family member.

## 2011-06-13 NOTE — ED Provider Notes (Signed)
History    Scribed for Benny Lennert, MD, the patient was seen in room APA03/APA03. This chart was scribed by Katha Cabal. This patient's care was started at 17:39.     CSN: [id number] Arrival date & time: 06/13/2011  5:31 PM  Chief Complaint  Patient presents with  . Suicidal    HPI  (Consider location/radiation/quality/duration/timing/severity/associated sxs/prior treatment)  HPI [name] [name] is a 60 y.o. male who presents to the Emergency Department complaining of with ongoing depression with associated SI and confusion (baseline).  Patient states that he "keeps thinking about suicide and hurting himself." Denies HI and abdominal pain.  Patient had a stroke last year and has had confusion and depression every since.  Patient sees Dr. Jeanie Sewer at [facility] every 3 months.  Patient takes Seroquel at bedtime and feels he needs medication during the day.     PAST MEDICAL HISTORY:  Past Medical History  Diagnosis Date  . Stroke   . Blood transfusion     PAST SURGICAL HISTORY:  Past Surgical History  Procedure Date  . Coronary artery bypass graft   . Knee arthroscopy     FAMILY HISTORY:  No family history on file.   SOCIAL HISTORY: History   Social History  . Marital Status: Divorced    Spouse Name: N/A    Number of Children: N/A  . Years of Education: N/A   Social History Main Topics  . Smoking status: Former Games developer  . Smokeless tobacco: None  . Alcohol Use: No  . Drug Use: No  . Sexually Active:    Other Topics Concern  . None   Social History Narrative  . None      Review of Systems  Review of Systems  Constitutional: Negative for fatigue.  HENT: Negative for congestion, sinus pressure and ear discharge.   Eyes: Negative for discharge.  Respiratory: Negative for cough.   Cardiovascular: Negative for chest pain.  Gastrointestinal: Negative for abdominal pain and diarrhea.  Genitourinary: Negative for frequency and hematuria.    Musculoskeletal: Negative for back pain.  Skin: Negative for rash.  Neurological: Negative for seizures and headaches.  Hematological: Negative.   Psychiatric/Behavioral: Positive for suicidal ideas and confusion (per patient baseline after stroke). Negative for hallucinations.    Allergies  Review of patient's allergies indicates no known allergies.  Home Medications   Current Outpatient Rx  Name Route Sig Dispense Refill  . QUETIAPINE FUMARATE 400 MG PO TABS Oral Take 400 mg by mouth at bedtime.        Physical Exam    BP 121/72  Pulse 101  Temp(Src) 98.6 F (37 C) (Oral)  Resp 21  Ht 6' (1.829 m)  Wt 240 lb (108.863 kg)  BMI 32.55 kg/m2  SpO2 99%  Physical Exam  Nursing note and vitals reviewed. Constitutional: He is oriented to person, place, and time. He appears well-developed and well-nourished.  HENT:  Head: Normocephalic and atraumatic.  Eyes: Conjunctivae and EOM are normal. No scleral icterus.  Neck: Neck supple. No thyromegaly present.  Cardiovascular: Normal rate and regular rhythm.  Exam reveals no gallop and no friction rub.   No murmur heard. Pulmonary/Chest: Effort normal. No stridor. No respiratory distress. He has no wheezes. He has no rales. He exhibits no tenderness.  Abdominal: Soft. He exhibits no distension. There is no tenderness. There is no rebound.  Musculoskeletal: Normal range of motion. He exhibits no edema.  Lymphadenopathy:    He has no cervical adenopathy.  Neurological: He is alert and oriented to person, place, and time. No sensory deficit. Coordination normal.  Skin: Skin is warm and dry. No rash noted. No erythema.  Psychiatric: His behavior is normal. He expresses suicidal ideation. He expresses no homicidal ideation.    ED Course  Procedures (including critical care time)  OTHER DATA REVIEWED: Nursing notes, vital signs, and past medical records reviewed.    DIAGNOSTIC STUDIES: Oxygen Saturation is 99% on room air,  normal by my interpretation.       LABS / RADIOLOGY:  Results for orders placed during the hospital encounter of 06/13/11  CBC      Component Value Range   WBC 5.4  4.0 - 10.5 (K/uL)   RBC 4.65  4.22 - 5.81 (MIL/uL)   Hemoglobin 14.3  13.0 - 17.0 (g/dL)   HCT 14.7  82.9 - 56.2 (%)   MCV 91.4  78.0 - 100.0 (fL)   MCH 30.8  26.0 - 34.0 (pg)   MCHC 33.6  30.0 - 36.0 (g/dL)   RDW 13.0  86.5 - 78.4 (%)   Platelets 145 (*) 150 - 400 (K/uL)  COMPREHENSIVE METABOLIC PANEL      Component Value Range   Sodium 140  135 - 145 (mEq/L)   Potassium 3.7  3.5 - 5.1 (mEq/L)   Chloride 103  96 - 112 (mEq/L)   CO2 28  19 - 32 (mEq/L)   Glucose, Bld 192 (*) 70 - 99 (mg/dL)   BUN 10  6 - 23 (mg/dL)   Creatinine, Ser 6.96  0.50 - 1.35 (mg/dL)   Calcium 9.6  8.4 - 29.5 (mg/dL)   Total Protein 8.0  6.0 - 8.3 (g/dL)   Albumin 3.7  3.5 - 5.2 (g/dL)   AST 15  0 - 37 (U/L)   ALT 9  0 - 53 (U/L)   Alkaline Phosphatase 76  39 - 117 (U/L)   Total Bilirubin 0.6  0.3 - 1.2 (mg/dL)   GFR calc non Af Amer >60  >60 (mL/min)   GFR calc Af Amer >60  >60 (mL/min)  ETHANOL      Component Value Range   Alcohol, Ethyl (B) <11  0 - 11 (mg/dL)  URINE RAPID DRUG SCREEN (HOSP PERFORMED)      Component Value Range   Opiates NONE DETECTED  NONE DETECTED    Cocaine NONE DETECTED  NONE DETECTED    Benzodiazepines NONE DETECTED  NONE DETECTED    Amphetamines NONE DETECTED  NONE DETECTED    Tetrahydrocannabinol NONE DETECTED  NONE DETECTED    Barbiturates NONE DETECTED  NONE DETECTED     No results found.   ED COURSE / COORDINATION OF CARE:   Orders Placed This Encounter  Procedures  . CBC  . Comprehensive metabolic panel  . Ethanol  . Drug screen panel, emergency  . Sitter at bedside  . Suicide precautions    MDM: suicidal   IMPRESSION: Diagnoses that have been ruled out:  Diagnoses that are still under consideration:  Final diagnoses:     MEDICATIONS GIVEN IN THE E.D. Scheduled Meds:    Continuous Infusions:      DISCHARGE MEDICATIONS: New Prescriptions   No medications on file     The chart was scribed for me under my direct supervision.  I personally performed the history, physical, and medical decision making and all procedures in the evaluation of this patient.Benny Lennert,  MD 06/13/11 2307

## 2011-06-14 ENCOUNTER — Inpatient Hospital Stay (HOSPITAL_COMMUNITY)
Admission: AD | Admit: 2011-06-14 | Discharge: 2011-06-23 | DRG: 885 | Disposition: A | Discharge status: Home / Self Care | Payer: Medicare Other | Source: Ambulatory Visit | Attending: Psychiatry | Admitting: Psychiatry

## 2011-06-14 DIAGNOSIS — I69959 Hemiplegia and hemiparesis following unspecified cerebrovascular disease affecting unspecified side: Secondary | ICD-10-CM

## 2011-06-14 DIAGNOSIS — Z7982 Long term (current) use of aspirin: Secondary | ICD-10-CM

## 2011-06-14 DIAGNOSIS — F339 Major depressive disorder, recurrent, unspecified: Secondary | ICD-10-CM

## 2011-06-14 DIAGNOSIS — E785 Hyperlipidemia, unspecified: Secondary | ICD-10-CM

## 2011-06-14 DIAGNOSIS — R45851 Suicidal ideations: Secondary | ICD-10-CM

## 2011-06-14 DIAGNOSIS — E119 Type 2 diabetes mellitus without complications: Secondary | ICD-10-CM

## 2011-06-14 DIAGNOSIS — F1011 Alcohol abuse, in remission: Secondary | ICD-10-CM

## 2011-06-15 LAB — LIPID PANEL
Cholesterol: 234 mg/dL — ABNORMAL HIGH (ref 0–200)
HDL: 65 mg/dL (ref >39)
Total CHOL/HDL Ratio: 3.6 RATIO
Triglycerides: 63 mg/dL (ref <150)
VLDL: 13 mg/dL (ref 0–40)

## 2011-06-15 LAB — GLUCOSE, CAPILLARY: Glucose-Capillary: 142 mg/dL — ABNORMAL HIGH (ref 70–99)
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
## 2011-06-16 LAB — GLUCOSE, CAPILLARY: Glucose-Capillary: 122 mg/dL — ABNORMAL HIGH (ref 70–99)

## 2011-06-17 LAB — GLUCOSE, CAPILLARY
Glucose-Capillary: 109 mg/dL — ABNORMAL HIGH (ref 70–99)
Glucose-Capillary: 125 mg/dL — ABNORMAL HIGH (ref 70–99)

## 2011-06-19 DIAGNOSIS — F339 Major depressive disorder, recurrent, unspecified: Secondary | ICD-10-CM

## 2011-06-19 LAB — GLUCOSE, CAPILLARY: Glucose-Capillary: 114 mg/dL — ABNORMAL HIGH (ref 70–99)

## 2011-06-19 LAB — ETHANOL: Alcohol, Ethyl (B): <5

## 2011-06-19 LAB — BASIC METABOLIC PANEL
BUN: 9
Chloride: 105
Creatinine, Ser: 0.6

## 2011-06-19 LAB — RAPID URINE DRUG SCREEN, HOSP PERFORMED
Benzodiazepines: NOT DETECTED
Cocaine: NOT DETECTED
Tetrahydrocannabinol: NOT DETECTED

## 2011-06-20 LAB — URINALYSIS, ROUTINE W REFLEX MICROSCOPIC
Ketones, ur: 15 — AB
Specific Gravity, Urine: >1.03 — ABNORMAL HIGH
pH: 6

## 2011-06-20 LAB — CBC
HCT: 40.3
HCT: 42.6
Hemoglobin: 13.6
MCHC: 33.6
MCV: 99.3
Platelets: 176
RBC: 4.15 — ABNORMAL LOW
RBC: 4.29
RDW: 12.6
WBC: 4.8

## 2011-06-20 LAB — DIFFERENTIAL
Basophils Absolute: 0
Eosinophils Absolute: 0.3
Eosinophils Relative: 3
Eosinophils Relative: 6 — ABNORMAL HIGH
Lymphocytes Relative: 29
Lymphs Abs: 1.6
Monocytes Absolute: 0.5
Monocytes Absolute: 0.6
Monocytes Relative: 10
Monocytes Relative: 9
Neutro Abs: 4

## 2011-06-20 LAB — BASIC METABOLIC PANEL
BUN: 9
CO2: 25
Calcium: 9.3
Chloride: 103
GFR calc Af Amer: >60
GFR calc non Af Amer: >60
Glucose, Bld: 128 — ABNORMAL HIGH
Potassium: 4
Potassium: 4.4
Sodium: 136

## 2011-06-20 LAB — RAPID URINE DRUG SCREEN, HOSP PERFORMED
Amphetamines: NOT DETECTED
Amphetamines: NOT DETECTED
Barbiturates: NOT DETECTED
Benzodiazepines: NOT DETECTED
Cocaine: NOT DETECTED
Opiates: NOT DETECTED
Tetrahydrocannabinol: NOT DETECTED
Tetrahydrocannabinol: NOT DETECTED

## 2011-06-20 LAB — ETHANOL: Alcohol, Ethyl (B): <5

## 2011-06-20 LAB — GLUCOSE, CAPILLARY: Glucose-Capillary: 110 mg/dL — ABNORMAL HIGH (ref 70–99)

## 2011-06-23 LAB — DIFFERENTIAL
Eosinophils Absolute: 0.4
Lymphs Abs: 1.8
Monocytes Absolute: 0.6
Monocytes Relative: 8
Neutrophils Relative %: 62

## 2011-06-23 LAB — ETHANOL: Alcohol, Ethyl (B): <5

## 2011-06-23 LAB — CBC
Hemoglobin: 12.8 — ABNORMAL LOW
MCV: 93.4
RBC: 4.15 — ABNORMAL LOW
WBC: 7.6

## 2011-06-23 LAB — BASIC METABOLIC PANEL
CO2: 25
Chloride: 110
Creatinine, Ser: 0.74
GFR calc Af Amer: >60
Potassium: 3.8
Sodium: 140

## 2011-06-23 LAB — URINALYSIS, ROUTINE W REFLEX MICROSCOPIC
Bilirubin Urine: NEGATIVE
Glucose, UA: NEGATIVE
Specific Gravity, Urine: 1.025

## 2011-06-23 LAB — RAPID URINE DRUG SCREEN, HOSP PERFORMED
Cocaine: NOT DETECTED
Opiates: NOT DETECTED

## 2011-06-23 LAB — URINE MICROSCOPIC-ADD ON
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
## 2013-02-07 ENCOUNTER — Emergency Department (HOSPITAL_COMMUNITY)
Admission: EM | Admit: 2013-02-07 | Discharge: 2013-02-07 | Disposition: A | Discharge status: Home / Self Care | Payer: Medicare Other | Attending: Emergency Medicine | Admitting: Emergency Medicine

## 2013-02-07 ENCOUNTER — Encounter (HOSPITAL_COMMUNITY): Payer: Self-pay | Admitting: *Deleted

## 2013-02-07 DIAGNOSIS — Z8673 Personal history of transient ischemic attack (TIA), and cerebral infarction without residual deficits: Secondary | ICD-10-CM | POA: Insufficient documentation

## 2013-02-07 DIAGNOSIS — R399 Unspecified symptoms and signs involving the genitourinary system: Secondary | ICD-10-CM

## 2013-02-07 DIAGNOSIS — R300 Dysuria: Secondary | ICD-10-CM | POA: Insufficient documentation

## 2013-02-07 DIAGNOSIS — F319 Bipolar disorder, unspecified: Secondary | ICD-10-CM | POA: Insufficient documentation

## 2013-02-07 DIAGNOSIS — Z87891 Personal history of nicotine dependence: Secondary | ICD-10-CM | POA: Insufficient documentation

## 2013-02-07 DIAGNOSIS — Z951 Presence of aortocoronary bypass graft: Secondary | ICD-10-CM | POA: Insufficient documentation

## 2013-02-07 HISTORY — DX: Bipolar disorder, unspecified: F31.9

## 2013-02-07 LAB — URINALYSIS, ROUTINE W REFLEX MICROSCOPIC
Glucose, UA: 250 mg/dL — AB
Leukocytes, UA: NEGATIVE
Protein, ur: NEGATIVE mg/dL
pH: 6 (ref 5.0–8.0)

## 2013-02-07 LAB — CBC
HCT: 39.1 % (ref 39.0–52.0)
MCHC: 34 g/dL (ref 30.0–36.0)
MCV: 91.6 fL (ref 78.0–100.0)
RDW: 13 % (ref 11.5–15.5)

## 2013-02-07 LAB — URINE MICROSCOPIC-ADD ON

## 2013-02-07 LAB — COMPREHENSIVE METABOLIC PANEL
Albumin: 3.6 g/dL (ref 3.5–5.2)
BUN: 11 mg/dL (ref 6–23)
Calcium: 9.8 mg/dL (ref 8.4–10.5)
Creatinine, Ser: 0.72 mg/dL (ref 0.50–1.35)
Total Protein: 8.5 g/dL — ABNORMAL HIGH (ref 6.0–8.3)

## 2013-02-07 MED ORDER — SODIUM CHLORIDE 0.9 % IV SOLN: INTRAVENOUS | Status: DC

## 2013-02-07 NOTE — ED Notes (Signed)
States he has been having strong dark urine for the [ast few days, sent from [facility] for evaluation of abnormal labs, patient has copy of labs with him

## 2013-02-07 NOTE — ED Provider Notes (Signed)
History     CSN: [id number]  Arrival date & time 02/07/13  1540   First MD Initiated Contact with Patient 02/07/13 1806      Chief Complaint  Patient presents with  . Dysuria    (Consider location/radiation/quality/duration/timing/severity/associated sxs/prior treatment) Patient is a 62 y.o. male presenting with dysuria. The history is provided by the patient.  Dysuria Pertinent negatives include no chest pain, no abdominal pain, no headaches and no shortness of breath.  pt indicates his urine has appearred 'strong' for the past couple days. States strong odor. Denies dysuria. No penile discharge. No scrotal or testicular pain. No abd or flank pain. No fever or chills. Denies change in meds or new meds. Does not feel ill or sick. No malaise. No nv.  States otherwise feels recent health at baseline.    Past Medical History  Diagnosis Date  . Stroke   . Blood transfusion   . Bipolar 1 disorder     Past Surgical History  Procedure Laterality Date  . Coronary artery bypass graft    . Knee arthroscopy      No family history on file.  History  Substance Use Topics  . Smoking status: Former Games developer  . Smokeless tobacco: Not on file  . Alcohol Use: No      Review of Systems  Constitutional: Negative for fever and chills.  HENT: Negative for neck pain.   Eyes: Negative for redness.  Respiratory: Negative for cough and shortness of breath.   Cardiovascular: Negative for chest pain.  Gastrointestinal: Negative for vomiting, abdominal pain and diarrhea.  Endocrine: Negative for polydipsia, polyphagia and polyuria.  Genitourinary: Negative for dysuria and flank pain.  Musculoskeletal: Negative for back pain.  Skin: Negative for rash.  Neurological: Negative for headaches.  Hematological: Does not bruise/bleed easily.  Psychiatric/Behavioral: Negative for confusion.    Allergies  Review of patient's allergies indicates no known allergies.  Home Medications    Current Outpatient Rx  Name  Route  Sig  Dispense  Refill  . QUEtiapine (SEROQUEL) 400 MG tablet   Oral   Take 400 mg by mouth at bedtime.           Marland Kitchen venlafaxine (EFFEXOR) 75 MG tablet   Oral   Take 150 mg by mouth every morning.           BP 140/88  Pulse 94  Temp(Src) 98.1 F (36.7 C) (Oral)  Resp 20  Ht 6' (1.829 m)  Wt 250 lb (113.399 kg)  BMI 33.9 kg/m2  SpO2 97%  Physical Exam  Nursing note and vitals reviewed. Constitutional: He is oriented to person, place, and time. He appears well-developed and well-nourished. No distress.  HENT:  Mouth/Throat: Oropharynx is clear and moist.  Eyes: Conjunctivae are normal. No scleral icterus.  Neck: Neck supple. No tracheal deviation present.  Cardiovascular: Normal rate, regular rhythm, normal heart sounds and intact distal pulses.   Pulmonary/Chest: Effort normal and breath sounds normal. No accessory muscle usage. No respiratory distress.  Abdominal: Soft. Bowel sounds are normal. He exhibits no distension and no mass. There is no tenderness. There is no rebound and no guarding.  Genitourinary:  No cva tenderness  Musculoskeletal: Normal range of motion. He exhibits no edema and no tenderness.  Neurological: He is alert and oriented to person, place, and time.  Skin: Skin is warm and dry.  Psychiatric: He has a normal mood and affect.    ED Course  Procedures (including critical care time)  Results for orders placed during the hospital encounter of 02/07/13  CBC      Result Value Range   WBC 6.1  4.0 - 10.5 K/uL   RBC 4.27  4.22 - 5.81 MIL/uL   Hemoglobin 13.3  13.0 - 17.0 g/dL   HCT 16.1  09.6 - 04.5 %   MCV 91.6  78.0 - 100.0 fL   MCH 31.1  26.0 - 34.0 pg   MCHC 34.0  30.0 - 36.0 g/dL   RDW 40.9  81.1 - 91.4 %   Platelets 142 (*) 150 - 400 K/uL  COMPREHENSIVE METABOLIC PANEL      Result Value Range   Sodium 138  135 - 145 mEq/L   Potassium 4.0  3.5 - 5.1 mEq/L   Chloride 100  96 - 112 mEq/L   CO2 26   19 - 32 mEq/L   Glucose, Bld 147 (*) 70 - 99 mg/dL   BUN 11  6 - 23 mg/dL   Creatinine, Ser 7.82  0.50 - 1.35 mg/dL   Calcium 9.8  8.4 - 95.6 mg/dL   Total Protein 8.5 (*) 6.0 - 8.3 g/dL   Albumin 3.6  3.5 - 5.2 g/dL   AST 19  0 - 37 U/L   ALT 13  0 - 53 U/L   Alkaline Phosphatase 77  39 - 117 U/L   Total Bilirubin 0.3  0.3 - 1.2 mg/dL   GFR calc non Af Amer >90  >90 mL/min   GFR calc Af Amer >90  >90 mL/min  URINALYSIS, ROUTINE W REFLEX MICROSCOPIC      Result Value Range   Color, Urine YELLOW  YELLOW   APPearance CLEAR  CLEAR   Specific Gravity, Urine >1.030 (*) 1.005 - 1.030   pH 6.0  5.0 - 8.0   Glucose, UA 250 (*) NEGATIVE mg/dL   Hgb urine dipstick TRACE (*) NEGATIVE   Bilirubin Urine NEGATIVE  NEGATIVE   Ketones, ur TRACE (*) NEGATIVE mg/dL   Protein, ur NEGATIVE  NEGATIVE mg/dL   Urobilinogen, UA 1.0  0.0 - 1.0 mg/dL   Nitrite NEGATIVE  NEGATIVE   Leukocytes, UA NEGATIVE  NEGATIVE  URINE MICROSCOPIC-ADD ON      Result Value Range   WBC, UA 3-6  <3 WBC/hpf   Bacteria, UA RARE  RARE   Urine-Other MUCOUS PRESENT          MDM  Labs.  Reviewed nursing notes and prior charts for additional history.   Po fluids.   Pt denies any c/o. Appears stable for d/c.          Suzi Roots, MD 02/07/13 2011

## 2014-08-11 ENCOUNTER — Encounter (HOSPITAL_COMMUNITY): Payer: Self-pay | Admitting: Emergency Medicine

## 2014-08-11 ENCOUNTER — Emergency Department (HOSPITAL_COMMUNITY)
Admission: EM | Admit: 2014-08-11 | Discharge: 2014-08-11 | Disposition: A | Discharge status: Home / Self Care | Payer: Medicare Other | Attending: Emergency Medicine | Admitting: Emergency Medicine

## 2014-08-11 DIAGNOSIS — Z87891 Personal history of nicotine dependence: Secondary | ICD-10-CM | POA: Insufficient documentation

## 2014-08-11 DIAGNOSIS — L02414 Cutaneous abscess of left upper limb: Secondary | ICD-10-CM | POA: Insufficient documentation

## 2014-08-11 DIAGNOSIS — L723 Sebaceous cyst: Secondary | ICD-10-CM

## 2014-08-11 DIAGNOSIS — Z951 Presence of aortocoronary bypass graft: Secondary | ICD-10-CM | POA: Insufficient documentation

## 2014-08-11 DIAGNOSIS — Z319 Encounter for procreative management, unspecified: Secondary | ICD-10-CM | POA: Insufficient documentation

## 2014-08-11 DIAGNOSIS — L089 Local infection of the skin and subcutaneous tissue, unspecified: Secondary | ICD-10-CM

## 2014-08-11 DIAGNOSIS — Z8673 Personal history of transient ischemic attack (TIA), and cerebral infarction without residual deficits: Secondary | ICD-10-CM | POA: Diagnosis not present

## 2014-08-11 MED ORDER — LIDOCAINE HCL (PF) 1 % IJ SOLN
5.0000 mL | Freq: Once | INTRAMUSCULAR | Status: DC
  Filled 2014-08-11: qty 5

## 2014-08-11 MED ORDER — SULFAMETHOXAZOLE-TRIMETHOPRIM 800-160 MG PO TABS: 1.0000 | ORAL_TABLET | Freq: Two times a day (BID) | ORAL | Status: DC

## 2014-08-11 MED ORDER — HYDROCODONE-ACETAMINOPHEN 5-325 MG PO TABS: 1.0000 | ORAL_TABLET | ORAL | Status: DC | PRN

## 2014-08-11 NOTE — ED Provider Notes (Signed)
CSN: [id number]     Arrival date & time 08/11/14  1603 History   First MD Initiated Contact with Patient 08/11/14 1612     Chief Complaint  Patient presents with  . Abscess     (Consider location/radiation/quality/duration/timing/severity/associated sxs/prior Treatment) The history is provided by the patient.   [name] [name] is a 63 y.o. male with a history of an abscess which developed on his left shoulder about 1 week ago which has expanded and become more sore over the past 3 days during which time it has also started to drain pus and blood.  He denies fevers or chills, nausea, vomiting, myalgias.  His past medical history is noncontributory.  He denies prior similar symptoms, but states there has been a "fullness" to his skin at this site for a long time.  It has never caused pain prior to now.    Past Medical History  Diagnosis Date  . Stroke   . Blood transfusion   . Bipolar 1 disorder    Past Surgical History  Procedure Laterality Date  . Coronary artery bypass graft    . Knee arthroscopy     History reviewed. No pertinent family history. History  Substance Use Topics  . Smoking status: Former Smoker -- 1.00 packs/day for 20 years    Types: Cigarettes    Quit date: 09/19/2003  . Smokeless tobacco: Never Used  . Alcohol Use: No    Review of Systems  Constitutional: Negative for fever and chills.  Respiratory: Negative for shortness of breath and wheezing.   Skin: Positive for wound. Negative for rash.  Neurological: Negative for numbness.      Allergies  Review of patient's allergies indicates no known allergies.  Home Medications   Prior to Admission medications   Medication Sig Start Date End Date Taking? Authorizing Provider  HYDROcodone-acetaminophen (NORCO/VICODIN) 5-325 MG per tablet Take 1 tablet by mouth every 4 (four) hours as needed. 08/11/14   Evalee Jefferson, PA-C  QUEtiapine (SEROQUEL) 400 MG tablet Take 400 mg by mouth at bedtime.       Historical Provider, MD  sulfamethoxazole-trimethoprim (SEPTRA DS) 800-160 MG per tablet Take 1 tablet by mouth every 12 (twelve) hours. 08/11/14   Evalee Jefferson, PA-C  venlafaxine (EFFEXOR) 75 MG tablet Take 150 mg by mouth every morning.    Historical Provider, MD   BP 141/93 mmHg  Pulse 111  Temp(Src) 98.3 F (36.8 C) (Oral)  Resp 18  Ht 6' (1.829 m)  Wt 240 lb (108.863 kg)  BMI 32.54 kg/m2  SpO2 99% Physical Exam  Constitutional: He appears well-developed and well-nourished. No distress.  HENT:  Head: Normocephalic.  Neck: Neck supple.  Cardiovascular: Normal rate.   Pulmonary/Chest: Effort normal. He has no wheezes.  Musculoskeletal: Normal range of motion. He exhibits no edema.  Skin: There is erythema.  Ulceration draining purulence dc medial abscess site left superior shoulder.  There is a more lateral punctum with dark keratotic plug, slightly draining pus as well with pressure application.  Entire area is 5 cm. No red streaking.    ED Course  Procedures (including critical care time)  INCISION AND DRAINAGE Performed by: Evalee Jefferson Consent: Verbal consent obtained. Risks and benefits: risks, benefits and alternatives were discussed Type: abscess  Body area: left superior shoulder  Anesthesia: local infiltration  No incision, already actively drainage from site  Local anesthetic: lidocaine 1% without epinephrine  Anesthetic total: 5 ml  Complexity: complex Blunt dissection to break up loculations  Drainage: purulent along with copious amounts of sebum  Drainage amount: copious  Packing material: na  Patient tolerance: Patient tolerated the procedure well with no immediate complications.    Labs Review Labs Reviewed - No data to display  Imaging Review No results found.   EKG Interpretation None      MDM   Final diagnoses:  Infected sebaceous cyst of skin    Pt was placed on bactrim,  Prescribed hydrocodone.  Encouraged he start warm  water soaks/shower water tx while massaging around this site to keep the site open and draining.  He was advised to see his pcp for recheck [facility]) or return here for any worsened sx.  The patient appears reasonably screened and/or stabilized for discharge and I doubt any other medical condition or other [facility] requiring further screening, evaluation, or treatment in the ED at this time prior to discharge.     Evalee Jefferson, PA-C 08/12/14 0124  Merryl Hacker, MD 08/12/14 2034

## 2014-08-11 NOTE — ED Notes (Signed)
Patient c/o abscess to left shoulder x3 days. Per patient draining. Thick serosanguinous drainage noted. Denies any fevers.

## 2014-08-11 NOTE — Discharge Instructions (Signed)
Epidermal Cyst An epidermal cyst is sometimes called a sebaceous cyst, epidermal inclusion cyst, or infundibular cyst. These cysts usually contain a substance that looks "pasty" or "cheesy" and may have a bad smell. This substance is a protein called keratin. Epidermal cysts are usually found on the face, neck, or trunk. They may also occur in the vaginal area or other parts of the genitalia of both men and women. Epidermal cysts are usually small, painless, slow-growing bumps or lumps that move freely under the skin. It is important not to try to pop them. This may cause an infection and lead to tenderness and swelling. CAUSES  Epidermal cysts may be caused by a deep penetrating injury to the skin or a plugged hair follicle, often associated with acne. SYMPTOMS  Epidermal cysts can become inflamed and cause:  Redness.  Tenderness.  Increased temperature of the skin over the bumps or lumps.  Grayish-white, bad smelling material that drains from the bump or lump. DIAGNOSIS  Epidermal cysts are easily diagnosed by your caregiver during an exam. Rarely, a tissue sample (biopsy) may be taken to rule out other conditions that may resemble epidermal cysts. TREATMENT   Epidermal cysts often get better and disappear on their own. They are rarely ever cancerous.  If a cyst becomes infected, it may become inflamed and tender. This may require opening and draining the cyst. Treatment with antibiotics may be necessary. When the infection is gone, the cyst may be removed with minor surgery.  Small, inflamed cysts can often be treated with antibiotics or by injecting steroid medicines.  Sometimes, epidermal cysts become large and bothersome. If this happens, surgical removal in your caregiver's office may be necessary. HOME CARE INSTRUCTIONS  Only take over-the-counter or prescription medicines as directed by your caregiver.  Take your antibiotics as directed. Finish them even if you start to feel  better. SEEK MEDICAL CARE IF:   Your cyst becomes tender, red, or swollen.  Your condition is not improving or is getting worse.  You have any other questions or concerns. MAKE SURE YOU:  Understand these instructions.  Will watch your condition.  Will get help right away if you are not doing well or get worse. Document Released: 08/05/2004 Document Revised: 11/27/2011 Document Reviewed: 03/13/2011 ExitCare Patient Information 2015 [employer]. This information is not intended to replace advice given to you by your health care provider. Make sure you discuss any questions you have with your health care provider.  

## 2014-08-11 NOTE — ED Notes (Signed)
Patient given discharge instruction, verbalized understand. Patient ambulatory out of the department.  

## 2016-06-18 ENCOUNTER — Encounter (HOSPITAL_COMMUNITY): Payer: Self-pay | Admitting: Emergency Medicine

## 2016-06-18 ENCOUNTER — Emergency Department (HOSPITAL_COMMUNITY)
Admission: EM | Admit: 2016-06-18 | Discharge: 2016-06-18 | Disposition: A | Discharge status: Home / Self Care | Payer: Medicare Other | Attending: Emergency Medicine | Admitting: Emergency Medicine

## 2016-06-18 DIAGNOSIS — Z87891 Personal history of nicotine dependence: Secondary | ICD-10-CM | POA: Insufficient documentation

## 2016-06-18 DIAGNOSIS — N342 Other urethritis: Secondary | ICD-10-CM

## 2016-06-18 MED ORDER — AZITHROMYCIN 250 MG PO TABS
1000.0000 mg | ORAL_TABLET | Freq: Once | ORAL | Status: AC
  Administered 2016-06-18: 1000 mg via ORAL
  Filled 2016-06-18: qty 4

## 2016-06-18 MED ORDER — LIDOCAINE HCL (PF) 1 % IJ SOLN
INTRAMUSCULAR | Status: AC
  Administered 2016-06-18: 0.9 mL
  Filled 2016-06-18: qty 5

## 2016-06-18 MED ORDER — CEFTRIAXONE SODIUM 250 MG IJ SOLR
250.0000 mg | Freq: Once | INTRAMUSCULAR | Status: AC
  Administered 2016-06-18: 250 mg via INTRAMUSCULAR
  Filled 2016-06-18: qty 250

## 2016-06-18 NOTE — ED Provider Notes (Signed)
[facility] DEPT Provider Note   CSN: [id number] Arrival date & time: 06/18/16  1303  By signing my name below, I, [name], attest that this documentation has been prepared under the direction and in the presence of Ripley Fraise, MD. Electronically Signed: [name], ED Scribe. 06/18/16. 1:31 PM.  History   Chief Complaint Chief Complaint  Patient presents with  . Penile Discharge    HPI HPI Comments: [name] [name] is a 65 y.o. male who presents to the Emergency Department complaining of mild, white, penile discharge x 1 day. He reports associated dysuria with similar onset. He reports recent unprotected sex with one male partner. Pt confirms his listed medical history. He has no known drug allergies. He denies fevers, chills, n/v, abd pain, testicular pain and back pain. He is unsure if he has been exposed to STD  The history is provided by the patient. No language interpreter was used.   Past Medical History:  Diagnosis Date  . Bipolar 1 disorder ([place])   . Blood transfusion   . Stroke [facility])     Patient Active Problem List   Diagnosis Date Noted  . SHOULDER PAIN 10/20/2008  . NECK PAIN 10/20/2008  . BACK PAIN 10/20/2008  . HYPERLIPIDEMIA 04/23/2008  . PSA, INCREASED 04/23/2008  . MICROALBUMINURIA 04/23/2008  . VENEREAL DISEASE 04/10/2008  . DIABETES MELLITUS, TYPE II, CONTROLLED 12/09/2007  . BIPOLAR DISORDER UNSPECIFIED 12/09/2007  . TOBACCO USER 12/09/2007  . ESSENTIAL HYPERTENSION 12/09/2007  . PHARYNGITIS 12/09/2007  . OVERACTIVE BLADDER 12/09/2007    Past Surgical History:  Procedure Laterality Date  . CORONARY ARTERY BYPASS GRAFT    . KNEE ARTHROSCOPY       Home Medications    Prior to Admission medications   Medication Sig Start Date End Date Taking? Authorizing Provider  QUEtiapine (SEROQUEL) 400 MG tablet Take 400 mg by mouth at bedtime.     Yes Historical Provider, MD  venlafaxine (EFFEXOR) 75 MG tablet Take 150 mg by mouth  every morning.   Yes Historical Provider, MD    Family History No family history on file.  Social History Social History  Substance Use Topics  . Smoking status: Former Smoker    Packs/day: 1.00    Years: 20.00    Types: Cigarettes    Quit date: 09/19/2003  . Smokeless tobacco: Never Used  . Alcohol use No     Allergies   Review of patient's allergies indicates no known allergies.   Review of Systems Review of Systems  Constitutional: Negative for chills and fever.  Gastrointestinal: Negative for abdominal pain, nausea and vomiting.  Genitourinary: Positive for discharge and dysuria. Negative for testicular pain.  Musculoskeletal: Negative for back pain.   Physical Exam Updated Vital Signs BP 133/87 (BP Location: Left Arm)   Pulse 111   Temp 97.8 F (36.6 C) (Temporal)   Resp 16   Ht 5\' 9"  (1.753 m)   Wt 230 lb (104.3 kg)   SpO2 97%   BMI 33.97 kg/m   Physical Exam  CONSTITUTIONAL: Well developed/well nourished HEAD: Normocephalic/atraumatic ENMT: Mucous membranes moist NECK: supple no meningeal signs CV: S1/S2 noted LUNGS: Lungs are clear to auscultation bilaterally, no apparent distress ABDOMEN: soft  GU: white milky penile discharge noted, no penile lesions, no scrotal tenderness.  Scribe chaperone present for exam NEURO: Pt is awake/alert/appropriate, moves all extremitiesx4.  No facial droop.   SKIN: warm, color normal PSYCH: no abnormalities of mood noted, alert and oriented to situation  ED Treatments / Results  Labs (all labs ordered are listed, but only abnormal results are displayed) Labs Reviewed  GC/CHLAMYDIA PROBE AMP ([place]) NOT AT [facility]    EKG  EKG Interpretation None       Radiology No results found.  Procedures Procedures  DIAGNOSTIC STUDIES: Oxygen Saturation is 97% on RA, normal by my interpretation.    COORDINATION OF CARE: 1:30 PM Discussed next steps with pt. Pt verbalized understanding and is agreeable with the  plan.    Medications Ordered in ED Medications  azithromycin (ZITHROMAX) tablet 1,000 mg (1,000 mg Oral Given 06/18/16 1345)  cefTRIAXone (ROCEPHIN) injection 250 mg (250 mg Intramuscular Given 06/18/16 1343)  lidocaine (PF) (XYLOCAINE) 1 % injection (0.9 mLs  Given 06/18/16 1344)     Initial Impression / Assessment and Plan / ED Course  I have reviewed the triage vital signs and the nursing notes.    Clinical Course    Pt with dysuria and milky white discharge.  Will treat for STD Offered HIV testing but pt declines He was counseled on safe sex practices Advised to avoid all sexual contact until tests resulted and all symptoms are gone   I personally performed the services described in this documentation, which was scribed in my presence. The recorded information has been reviewed and is accurate.      Final Clinical Impressions(s) / ED Diagnoses   Final diagnoses:  Urethritis    New Prescriptions New Prescriptions   No medications on file     Ripley Fraise, MD 06/18/16 1433

## 2016-06-18 NOTE — ED Triage Notes (Signed)
Burns when he pees.

## 2016-06-18 NOTE — ED Notes (Signed)
Patient waiting 15 minutes for med time.

## 2016-06-18 NOTE — ED Triage Notes (Signed)
Pt woke up yesterday morning with a burning and discharge from his penis. "Discharge is white, milky like color." Pt states he had something like this "20 some years ago"

## 2016-06-18 NOTE — ED Notes (Signed)
Med time complete. No allergic reaction noted.

## 2016-06-19 LAB — GC/CHLAMYDIA PROBE AMP (~~LOC~~) NOT AT ARMC
Chlamydia: NEGATIVE
Neisseria Gonorrhea: POSITIVE — AB

## 2016-06-20 ENCOUNTER — Telehealth (HOSPITAL_BASED_OUTPATIENT_CLINIC_OR_DEPARTMENT_OTHER): Payer: Self-pay | Admitting: Emergency Medicine

## 2016-08-01 ENCOUNTER — Telehealth (HOSPITAL_BASED_OUTPATIENT_CLINIC_OR_DEPARTMENT_OTHER): Payer: Self-pay | Admitting: Emergency Medicine

## 2016-08-01 NOTE — Telephone Encounter (Signed)
No response to letter LOST TO FOLLOWUP 

## 2017-09-14 ENCOUNTER — Emergency Department (HOSPITAL_COMMUNITY): Payer: Medicare Other

## 2017-09-14 ENCOUNTER — Encounter (HOSPITAL_COMMUNITY): Payer: Self-pay

## 2017-09-14 ENCOUNTER — Inpatient Hospital Stay (HOSPITAL_COMMUNITY)
Admission: EM | Admit: 2017-09-14 | Discharge: 2017-09-23 | DRG: 444 | Disposition: A | Discharge status: Hospice | Payer: Medicare Other | Attending: Family Medicine | Admitting: Family Medicine

## 2017-09-14 ENCOUNTER — Other Ambulatory Visit: Payer: Self-pay

## 2017-09-14 DIAGNOSIS — K838 Other specified diseases of biliary tract: Secondary | ICD-10-CM | POA: Diagnosis not present

## 2017-09-14 DIAGNOSIS — N179 Acute kidney failure, unspecified: Secondary | ICD-10-CM | POA: Diagnosis not present

## 2017-09-14 DIAGNOSIS — C7801 Secondary malignant neoplasm of right lung: Secondary | ICD-10-CM | POA: Diagnosis present

## 2017-09-14 DIAGNOSIS — Z8673 Personal history of transient ischemic attack (TIA), and cerebral infarction without residual deficits: Secondary | ICD-10-CM

## 2017-09-14 DIAGNOSIS — K869 Disease of pancreas, unspecified: Secondary | ICD-10-CM | POA: Diagnosis not present

## 2017-09-14 DIAGNOSIS — Z8052 Family history of malignant neoplasm of bladder: Secondary | ICD-10-CM

## 2017-09-14 DIAGNOSIS — Z7984 Long term (current) use of oral hypoglycemic drugs: Secondary | ICD-10-CM

## 2017-09-14 DIAGNOSIS — E872 Acidosis: Secondary | ICD-10-CM | POA: Diagnosis present

## 2017-09-14 DIAGNOSIS — R5381 Other malaise: Secondary | ICD-10-CM | POA: Diagnosis not present

## 2017-09-14 DIAGNOSIS — F319 Bipolar disorder, unspecified: Secondary | ICD-10-CM | POA: Diagnosis not present

## 2017-09-14 DIAGNOSIS — K8051 Calculus of bile duct without cholangitis or cholecystitis with obstruction: Principal | ICD-10-CM | POA: Diagnosis present

## 2017-09-14 DIAGNOSIS — C7989 Secondary malignant neoplasm of other specified sites: Secondary | ICD-10-CM | POA: Diagnosis present

## 2017-09-14 DIAGNOSIS — C787 Secondary malignant neoplasm of liver and intrahepatic bile duct: Secondary | ICD-10-CM | POA: Diagnosis present

## 2017-09-14 DIAGNOSIS — Z515 Encounter for palliative care: Secondary | ICD-10-CM | POA: Diagnosis present

## 2017-09-14 DIAGNOSIS — R188 Other ascites: Secondary | ICD-10-CM | POA: Diagnosis present

## 2017-09-14 DIAGNOSIS — F419 Anxiety disorder, unspecified: Secondary | ICD-10-CM | POA: Diagnosis present

## 2017-09-14 DIAGNOSIS — I959 Hypotension, unspecified: Secondary | ICD-10-CM | POA: Diagnosis not present

## 2017-09-14 DIAGNOSIS — Z79899 Other long term (current) drug therapy: Secondary | ICD-10-CM

## 2017-09-14 DIAGNOSIS — Z951 Presence of aortocoronary bypass graft: Secondary | ICD-10-CM

## 2017-09-14 DIAGNOSIS — R945 Abnormal results of liver function studies: Secondary | ICD-10-CM | POA: Diagnosis not present

## 2017-09-14 DIAGNOSIS — R1010 Upper abdominal pain, unspecified: Secondary | ICD-10-CM | POA: Diagnosis not present

## 2017-09-14 DIAGNOSIS — R634 Abnormal weight loss: Secondary | ICD-10-CM | POA: Diagnosis not present

## 2017-09-14 DIAGNOSIS — R109 Unspecified abdominal pain: Secondary | ICD-10-CM | POA: Diagnosis present

## 2017-09-14 DIAGNOSIS — E7220 Disorder of urea cycle metabolism, unspecified: Secondary | ICD-10-CM

## 2017-09-14 DIAGNOSIS — K7200 Acute and subacute hepatic failure without coma: Secondary | ICD-10-CM | POA: Diagnosis present

## 2017-09-14 DIAGNOSIS — E871 Hypo-osmolality and hyponatremia: Secondary | ICD-10-CM | POA: Diagnosis present

## 2017-09-14 DIAGNOSIS — I10 Essential (primary) hypertension: Secondary | ICD-10-CM | POA: Diagnosis present

## 2017-09-14 DIAGNOSIS — Z66 Do not resuscitate: Secondary | ICD-10-CM | POA: Diagnosis not present

## 2017-09-14 DIAGNOSIS — Z800 Family history of malignant neoplasm of digestive organs: Secondary | ICD-10-CM

## 2017-09-14 DIAGNOSIS — T508X5A Adverse effect of diagnostic agents, initial encounter: Secondary | ICD-10-CM | POA: Diagnosis not present

## 2017-09-14 DIAGNOSIS — R933 Abnormal findings on diagnostic imaging of other parts of digestive tract: Secondary | ICD-10-CM | POA: Diagnosis not present

## 2017-09-14 DIAGNOSIS — C250 Malignant neoplasm of head of pancreas: Secondary | ICD-10-CM | POA: Diagnosis present

## 2017-09-14 DIAGNOSIS — R791 Abnormal coagulation profile: Secondary | ICD-10-CM

## 2017-09-14 DIAGNOSIS — R630 Anorexia: Secondary | ICD-10-CM | POA: Diagnosis present

## 2017-09-14 DIAGNOSIS — C7802 Secondary malignant neoplasm of left lung: Secondary | ICD-10-CM | POA: Diagnosis present

## 2017-09-14 DIAGNOSIS — D696 Thrombocytopenia, unspecified: Secondary | ICD-10-CM | POA: Diagnosis present

## 2017-09-14 DIAGNOSIS — R6510 Systemic inflammatory response syndrome (SIRS) of non-infectious origin without acute organ dysfunction: Secondary | ICD-10-CM | POA: Diagnosis present

## 2017-09-14 DIAGNOSIS — K8050 Calculus of bile duct without cholangitis or cholecystitis without obstruction: Secondary | ICD-10-CM | POA: Diagnosis not present

## 2017-09-14 DIAGNOSIS — Z87891 Personal history of nicotine dependence: Secondary | ICD-10-CM

## 2017-09-14 DIAGNOSIS — E860 Dehydration: Secondary | ICD-10-CM | POA: Diagnosis present

## 2017-09-14 DIAGNOSIS — E119 Type 2 diabetes mellitus without complications: Secondary | ICD-10-CM

## 2017-09-14 DIAGNOSIS — K7689 Other specified diseases of liver: Secondary | ICD-10-CM | POA: Diagnosis not present

## 2017-09-14 DIAGNOSIS — K831 Obstruction of bile duct: Secondary | ICD-10-CM | POA: Diagnosis not present

## 2017-09-14 DIAGNOSIS — Z79890 Hormone replacement therapy (postmenopausal): Secondary | ICD-10-CM

## 2017-09-14 DIAGNOSIS — N390 Urinary tract infection, site not specified: Secondary | ICD-10-CM | POA: Diagnosis not present

## 2017-09-14 DIAGNOSIS — R17 Unspecified jaundice: Secondary | ICD-10-CM | POA: Diagnosis not present

## 2017-09-14 DIAGNOSIS — D649 Anemia, unspecified: Secondary | ICD-10-CM | POA: Diagnosis not present

## 2017-09-14 DIAGNOSIS — Z7189 Other specified counseling: Secondary | ICD-10-CM

## 2017-09-14 DIAGNOSIS — R1900 Intra-abdominal and pelvic swelling, mass and lump, unspecified site: Secondary | ICD-10-CM

## 2017-09-14 DIAGNOSIS — C801 Malignant (primary) neoplasm, unspecified: Secondary | ICD-10-CM | POA: Diagnosis not present

## 2017-09-14 DIAGNOSIS — K8020 Calculus of gallbladder without cholecystitis without obstruction: Secondary | ICD-10-CM

## 2017-09-14 DIAGNOSIS — R000 Tachycardia, unspecified: Secondary | ICD-10-CM | POA: Diagnosis present

## 2017-09-14 LAB — COMPREHENSIVE METABOLIC PANEL
ALT: 59 U/L (ref 17–63)
AST: 95 U/L — ABNORMAL HIGH (ref 15–41)
Albumin: 2.5 g/dL — ABNORMAL LOW (ref 3.5–5.0)
Alkaline Phosphatase: 410 U/L — ABNORMAL HIGH (ref 38–126)
Anion gap: 14 (ref 5–15)
BUN: 10 mg/dL (ref 6–20)
CHLORIDE: 99 mmol/L — AB (ref 101–111)
CO2: 23 mmol/L (ref 22–32)
CREATININE: 0.37 mg/dL — AB (ref 0.61–1.24)
Calcium: 9.6 mg/dL (ref 8.9–10.3)
GFR calc non Af Amer: >60 mL/min (ref >60)
GLUCOSE: 209 mg/dL — AB (ref 65–99)
Potassium: 3.8 mmol/L (ref 3.5–5.1)
SODIUM: 136 mmol/L (ref 135–145)
Total Bilirubin: 23.4 mg/dL (ref 0.3–1.2)
Total Protein: 8.5 g/dL — ABNORMAL HIGH (ref 6.5–8.1)

## 2017-09-14 LAB — I-STAT CG4 LACTIC ACID, ED
LACTIC ACID, VENOUS: 2.63 mmol/L — AB (ref 0.5–1.9)
Lactic Acid, Venous: 2.89 mmol/L (ref 0.5–1.9)

## 2017-09-14 LAB — CBC WITH DIFFERENTIAL/PLATELET
BASOS ABS: 0 10*3/uL (ref 0.0–0.1)
Basophils Relative: 1 %
Eosinophils Absolute: 0.2 10*3/uL (ref 0.0–0.7)
Eosinophils Relative: 4 %
HEMATOCRIT: 35.6 % — AB (ref 39.0–52.0)
Hemoglobin: 12.2 g/dL — ABNORMAL LOW (ref 13.0–17.0)
LYMPHS ABS: 1.3 10*3/uL (ref 0.7–4.0)
LYMPHS PCT: 24 %
MCH: 29.1 pg (ref 26.0–34.0)
MCHC: 34.3 g/dL (ref 30.0–36.0)
MCV: 85 fL (ref 78.0–100.0)
Monocytes Absolute: 0.8 10*3/uL (ref 0.1–1.0)
Monocytes Relative: 14 %
NEUTROS ABS: 3.1 10*3/uL (ref 1.7–7.7)
Neutrophils Relative %: 57 %
Platelets: 155 10*3/uL (ref 150–400)
RBC: 4.19 MIL/uL — AB (ref 4.22–5.81)
RDW: 17.9 % — ABNORMAL HIGH (ref 11.5–15.5)
WBC: 5.4 10*3/uL (ref 4.0–10.5)

## 2017-09-14 LAB — URINALYSIS, ROUTINE W REFLEX MICROSCOPIC
Glucose, UA: 50 mg/dL — AB
Hgb urine dipstick: NEGATIVE
KETONES UR: NEGATIVE mg/dL
Leukocytes, UA: NEGATIVE
Nitrite: NEGATIVE
PH: 5 (ref 5.0–8.0)
PROTEIN: 30 mg/dL — AB
Specific Gravity, Urine: 1.02 (ref 1.005–1.030)

## 2017-09-14 LAB — BILIRUBIN, FRACTIONATED(TOT/DIR/INDIR)
BILIRUBIN DIRECT: 15.2 mg/dL — AB (ref 0.1–0.5)
BILIRUBIN TOTAL: 23 mg/dL — AB (ref 0.3–1.2)
Indirect Bilirubin: 7.8 mg/dL — ABNORMAL HIGH (ref 0.3–0.9)

## 2017-09-14 LAB — RAPID URINE DRUG SCREEN, HOSP PERFORMED
Amphetamines: NOT DETECTED
Barbiturates: NOT DETECTED
Benzodiazepines: NOT DETECTED
Cocaine: NOT DETECTED
OPIATES: NOT DETECTED
TETRAHYDROCANNABINOL: NOT DETECTED

## 2017-09-14 LAB — ETHANOL: Alcohol, Ethyl (B): <10 mg/dL (ref <10)

## 2017-09-14 LAB — PROTIME-INR
INR: 1.35
Prothrombin Time: 16.6 seconds — ABNORMAL HIGH (ref 11.4–15.2)

## 2017-09-14 LAB — AMMONIA: AMMONIA: 71 umol/L — AB (ref 9–35)

## 2017-09-14 MED ORDER — IOPAMIDOL (ISOVUE-300) INJECTION 61%
100.0000 mL | Freq: Once | INTRAVENOUS | Status: AC | PRN
  Administered 2017-09-14: 100 mL via INTRAVENOUS

## 2017-09-14 MED ORDER — PRAVASTATIN SODIUM 40 MG PO TABS: 40.0000 mg | ORAL_TABLET | Freq: Every day | ORAL | Status: DC

## 2017-09-14 MED ORDER — PNEUMOCOCCAL VAC POLYVALENT 25 MCG/0.5ML IJ INJ
0.5000 mL | INJECTION | INTRAMUSCULAR | Status: DC
  Filled 2017-09-14: qty 0.5

## 2017-09-14 MED ORDER — INFLUENZA VAC SPLIT HIGH-DOSE 0.5 ML IM SUSY
0.5000 mL | PREFILLED_SYRINGE | INTRAMUSCULAR | Status: DC
  Filled 2017-09-14: qty 0.5

## 2017-09-14 MED ORDER — ENSURE ENLIVE PO LIQD: 237.0000 mL | Freq: Two times a day (BID) | ORAL | Status: DC

## 2017-09-14 MED ORDER — MORPHINE SULFATE (PF) 2 MG/ML IV SOLN
2.0000 mg | Freq: Once | INTRAVENOUS | Status: AC
  Administered 2017-09-14: 2 mg via INTRAVENOUS
  Filled 2017-09-14: qty 1

## 2017-09-14 MED ORDER — SODIUM CHLORIDE 0.9 % IV BOLUS (SEPSIS)
1000.0000 mL | Freq: Once | INTRAVENOUS | Status: AC
  Administered 2017-09-14: 1000 mL via INTRAVENOUS

## 2017-09-14 MED ORDER — INSULIN ASPART 100 UNIT/ML ~~LOC~~ SOLN
0.0000 [IU] | Freq: Three times a day (TID) | SUBCUTANEOUS | Status: DC
  Administered 2017-09-15: 2 [IU] via SUBCUTANEOUS
  Administered 2017-09-15: 3 [IU] via SUBCUTANEOUS
  Administered 2017-09-15: 2 [IU] via SUBCUTANEOUS
  Administered 2017-09-16: 3 [IU] via SUBCUTANEOUS
  Administered 2017-09-16: 2 [IU] via SUBCUTANEOUS
  Administered 2017-09-16: 3 [IU] via SUBCUTANEOUS
  Administered 2017-09-17: 2 [IU] via SUBCUTANEOUS
  Administered 2017-09-17: 5 [IU] via SUBCUTANEOUS
  Administered 2017-09-17: 1 [IU] via SUBCUTANEOUS
  Administered 2017-09-18: 2 [IU] via SUBCUTANEOUS
  Administered 2017-09-18: 1 [IU] via SUBCUTANEOUS
  Administered 2017-09-18: 3 [IU] via SUBCUTANEOUS
  Administered 2017-09-19 – 2017-09-20 (×4): 1 [IU] via SUBCUTANEOUS
  Administered 2017-09-20: 2 [IU] via SUBCUTANEOUS
  Administered 2017-09-21 (×2): 1 [IU] via SUBCUTANEOUS
  Administered 2017-09-22: 2 [IU] via SUBCUTANEOUS
  Administered 2017-09-22: 1 [IU] via SUBCUTANEOUS
  Administered 2017-09-22 – 2017-09-23 (×2): 2 [IU] via SUBCUTANEOUS
  Administered 2017-09-23: 1 [IU] via SUBCUTANEOUS

## 2017-09-14 MED ORDER — QUETIAPINE FUMARATE ER 400 MG PO TB24
400.0000 mg | ORAL_TABLET | Freq: Every day | ORAL | Status: DC
  Administered 2017-09-14 – 2017-09-22 (×9): 400 mg via ORAL
  Filled 2017-09-14 (×9): qty 1

## 2017-09-14 MED ORDER — POTASSIUM CHLORIDE IN NACL 20-0.9 MEQ/L-% IV SOLN
INTRAVENOUS | Status: DC
  Administered 2017-09-14 – 2017-09-15 (×2): via INTRAVENOUS
  Filled 2017-09-14 (×3): qty 1000

## 2017-09-14 MED ORDER — LEVOTHYROXINE SODIUM 50 MCG PO TABS: 50.0000 ug | ORAL_TABLET | Freq: Every day | ORAL | Status: DC

## 2017-09-14 MED ORDER — VENLAFAXINE HCL 75 MG PO TABS: 150.0000 mg | ORAL_TABLET | Freq: Every morning | ORAL | Status: DC

## 2017-09-14 MED ORDER — POLYETHYLENE GLYCOL 3350 17 G PO PACK
17.0000 g | PACK | Freq: Every day | ORAL | Status: DC
  Administered 2017-09-16 – 2017-09-22 (×5): 17 g via ORAL
  Filled 2017-09-14 (×5): qty 1

## 2017-09-14 MED ORDER — HYDROCODONE-ACETAMINOPHEN 5-325 MG PO TABS
1.0000 | ORAL_TABLET | ORAL | Status: DC | PRN
  Administered 2017-09-14 – 2017-09-23 (×11): 1 via ORAL
  Filled 2017-09-14 (×12): qty 1

## 2017-09-14 MED ORDER — ENOXAPARIN SODIUM 40 MG/0.4ML ~~LOC~~ SOLN: 40.0000 mg | SUBCUTANEOUS | Status: DC

## 2017-09-14 NOTE — ED Notes (Signed)
Pt voiding in urinal to get sample for lab

## 2017-09-14 NOTE — H&P (Addendum)
History and Physical    [name] [name]:[id number] DOB: 1950-11-23 DOA: 09/14/2017  PCP: Lucia Gaskins, MD   Patient coming from: Home  Chief Complaint: Dizziness, abdominal Pain, yellow eyes.  HPI: [name] [name] is a 66 y.o. male with medical history significant for  DM2, HTN, BPD, who presented to the ED with complaints of dizziness that started this morning, such that he was was unable to get up from his bed.  Also noted abdominal pain of about 2 months duration worsened over the past 2 days.  This morning patient's sisters came to visit him and noticed that his eyes were yellow, together with the dizziness being suspect that he go to the ED. she reports about the past 2 weeks he has also noticed a change in his urine color-now brownish, stool colour now very pale.  No itching. Patient notes at least a 30 pound weight loss over the past month.  Back pains but no headache. Patient denies difficulty breathing no cough, no chest pain.  No fever no chills.  Endorses poor p.o. Intake. Patient has a primary care provider but never had a colonoscopy- couldnt tell me why.  Quit smoking cigarettes 5 years ago-previously smoked 1 pack/day.  Quit drinking alcohol 5-6 years ago, after several DWI.  Family history-breast cancer 2 sisters, bladder cancer mother. No known family history of colon cancer or other malignancies.  ED Course: Tachycardia pulse 120s, blood pressure initially soft 85/64 improved with fluids.  Patient's bilirubin was markedly elevated at 23, AST mildly elevated at 95, ALT -59, ALP also elevated at 410.  Neg Alcohol level. Lactic acid elevated at 2.89.  Abdominal/pelvic CT-liver and lung metastasis.,  Abdominal lymph nodes.  It was given 1 L bolus in the ED. Blood cultures were drawn in ED. GI was consulted by EDP, they will see patient in consult at [facility].  Review of Systems: As per HPI otherwise 10 point review of systems negative.  Past Medical History:  Diagnosis  Date  . Bipolar 1 disorder ([place])   . Blood transfusion   . Stroke [facility])     Past Surgical History:  Procedure Laterality Date  . CORONARY ARTERY BYPASS GRAFT    . KNEE ARTHROSCOPY       reports that he quit smoking about 13 years ago. His smoking use included cigarettes. He has a 20.00 pack-year smoking history. he has never used smokeless tobacco. He reports that he does not drink alcohol or use drugs.  No Known Allergies  Prior to Admission medications   Medication Sig Start Date End Date Taking? Authorizing Provider  QUEtiapine (SEROQUEL XR) 400 MG 24 hr tablet Take 400 mg by mouth at bedtime.  09/08/17  Yes [provider]  levothyroxine (SYNTHROID, LEVOTHROID) 50 MCG tablet Take 50 mcg by mouth daily. 07/24/17   [provider]  metFORMIN (GLUCOPHAGE) 500 MG tablet Take 500 mg by mouth 2 (two) times daily. 07/24/17   [provider]  pravastatin (PRAVACHOL) 40 MG tablet Take 40 mg by mouth daily. 07/24/17   [provider]  venlafaxine (EFFEXOR) 75 MG tablet Take 150 mg by mouth every morning.    [provider]    Physical Exam: Vitals:   09/14/17 1700 09/14/17 1800 09/14/17 1851 09/14/17 1900  BP: 111/81 100/87 115/88 108/89  Pulse: (!) 110 (!) 110 (!) 114 (!) 114  Resp: 20 20 19 19   Temp:      TempSrc:      SpO2: 96%  98% 97% 96%  Height:        Constitutional: NAD, calm, comfortable Vitals:   09/14/17 1700 09/14/17 1800 09/14/17 1851 09/14/17 1900  BP: 111/81 100/87 115/88 108/89  Pulse: (!) 110 (!) 110 (!) 114 (!) 114  Resp: 20 20 19 19   Temp:      TempSrc:      SpO2: 96% 98% 97% 96%  Height:       Eyes: PERRL,and icteric conjunctivae  ENMT: Mucous membranes are dry. Posterior pharynx clear of any exudate or lesions.Normal dentition.  Neck: normal, supple, no masses, no thyromegaly Respiratory: clear to auscultation bilaterally, no wheezing, no crackles. Normal respiratory effort. No accessory muscle use.    Cardiovascular: tachycardiac, no murmurs / rubs / gallops. No extremity edema. 2+ pedal pulses. No carotid bruits.  Abdomen: Mild abdominal tenderness, no masses palpated. No hepatosplenomegaly. Bowel sounds positive.  Musculoskeletal:  Good ROM, no contractures. Normal muscle tone.  Skin: no rashes, lesions, ulcers. No induration Neurologic: CN 2-12 grossly intact. Sensation intact. Strength 5/5 in all 4.  Psychiatric: Normal judgment and insight. Alert and oriented x 3. Normal mood.   Labs on Admission: I have personally reviewed following labs and imaging studies  CBC: Recent Labs  Lab 09/14/17 1255  WBC 5.4  NEUTROABS 3.1  HGB 12.2*  HCT 35.6*  MCV 85.0  PLT 782   Basic Metabolic Panel: Recent Labs  Lab 09/14/17 1255  NA 136  K 3.8  CL 99*  CO2 23  GLUCOSE 209*  BUN 10  CREATININE 0.37*  CALCIUM 9.6   GFR: CrCl cannot be calculated (Unknown ideal weight.). Liver Function Tests: Recent Labs  Lab 09/14/17 1255  AST 95*  ALT 59  ALKPHOS 410*  BILITOT 23.4*  PROT 8.5*  ALBUMIN 2.5*   No results for input(s): LIPASE, AMYLASE in the last 168 hours. Recent Labs  Lab 09/14/17 1256  AMMONIA 71*   Coagulation Profile: Recent Labs  Lab 09/14/17 1255  INR 1.35   Urine analysis:    Component Value Date/Time   COLORURINE AMBER (A) 09/14/2017 1256   APPEARANCEUR CLOUDY (A) 09/14/2017 1256   LABSPEC 1.020 09/14/2017 1256   PHURINE 5.0 09/14/2017 1256   GLUCOSEU 50 (A) 09/14/2017 1256   HGBUR NEGATIVE 09/14/2017 1256   BILIRUBINUR MODERATE (A) 09/14/2017 1256   KETONESUR NEGATIVE 09/14/2017 1256   PROTEINUR 30 (A) 09/14/2017 1256   UROBILINOGEN 1.0 02/07/2013 1814   NITRITE NEGATIVE 09/14/2017 1256   LEUKOCYTESUR NEGATIVE 09/14/2017 1256    Radiological Exams on Admission: Ct Abdomen Pelvis W Contrast  Result Date: 09/14/2017 CLINICAL DATA:  Hematuria for 2 weeks. EXAM: CT ABDOMEN AND PELVIS WITH CONTRAST TECHNIQUE: Multidetector CT imaging of the  abdomen and pelvis was performed using the standard protocol following bolus administration of intravenous contrast. CONTRAST:  146mL ISOVUE-300 IOPAMIDOL (ISOVUE-300) INJECTION 61% COMPARISON:  None. FINDINGS: Lower chest: Multiple nodules are identified in bilateral lung bases largest in the lateral left lung base measuring 7 mm. Scarring of right lung base is identified. The heart size is normal. Hepatobiliary: Multiple heterogeneously enhancing masses are identified within the liver. There is intrahepatic biliary ductal dilatation. No gallstone is identified. Pancreas: There multiple abnormal enlarged firm enhancing lymph nodes along the superior mesenteric and celiac axis. These abnormal lymph nodes are inseparable from the posterior aspect of the head the pancreas particularly on image 38 series 2. Spleen: Normal in size without focal abnormality. Adrenals/Urinary Tract: The adrenal glands are normal. There are small simple cysts  in both kidneys. There is no hydronephrosis bilaterally. The bladder is partially decompressed without gross abnormality. Stomach/Bowel: There is a thick walled stomach particularly in the mid to distal portion extending into the duodenum. There is no small bowel obstruction or diverticulitis. Vascular/Lymphatic: Enlarged abnormal lymph nodes are identified along the celiac and superior mesenteric axis. There also smaller lymph nodes in the right lower quadrant and along the periaortic region. Atherosclerosis of the abdominal aorta is identified without aneurysmal dilatation. Reproductive: Prostate enlargement is noted. Other: Small ascites is identified in the abdomen and pelvis. Musculoskeletal: Degenerative joint changes of the spine are identified. IMPRESSION: Findings consistent with metastasis in the liver and in the lungs. Abnormal metastatic lymph nodes are also identified in the abdomen. There is thick wall stomach particularly in the mid to distal portion extending into the  duodenum, neoplastic involvement is not excluded. There is also inseparable abnormal lymph nodes from the posterior aspect of had the pancreas, neoplastic involvement is not excluded. Electronically Signed   By: Abelardo Diesel M.D.   On: 09/14/2017 15:35    [facility].  Assessment/Plan Principal Problem:   Abdominal pain Active Problems:   Obstructive jaundice   DM2 (diabetes mellitus, type 2) ([facility])   BIPOLAR DISORDER UNSPECIFIED   Essential hypertension  Abdominal pain with obstructive jaundice- likely 2/2 to liver mets ?primary.  No prior endoscopies. Quit Alc and Cig- 5 yrs ago. Family hx- breast and bladder cancer. -GI consulted by EDP at [facility], Follow-up GI recommendations appreciated in advance - NPO midnight pending GI eval -Fractionated bilirubin - CMP am - hydrate -IV morphine 2 mg x1, Norco -Will need oncology and may be surgical consult  Lactic acidosis-with tachycardia and hypotension. Lactic acid 2.89 >2.6. No leukocytosis. 1L bolus given in the ED. Likely 2/2 dehydration, with poor p.o. -Hydrate, repeat 1l bolus - IV fluids Ns + 20KCL  X 12 hrs -trend Lactic acid - tele Admission, EKG for tachycardia  - Follow up blood cultures drawn in ED  UTI- uA shows many bact, no dysuria. No leukocytosis, no fevers, but With lactic acidosis, tachycardia and hypotension, meeting SIRS criteria. - will add on urine cultures. - IV ceftriazone 1g daily  DM2- No recent HgA1c on file.  Home meds metformin - HgbA1c - SSI  Hypertension- Pt hypotensive.  Not on home bp meds -Hydrate  BPD-appears stable -Continue home medications Seroquel and venlafaxine  DVT prophylaxis: Lovenox Code Status: Full Family Communication: 2 Sisters at bedside  Disposition Plan: To be determined Consults called: GI by EDP Admission status: Inpt, tele   Bethena Roys MD [facility] Pager [phone]  If 7PM-7AM, please contact night-coverage [email or web address] Password  [facility]  09/14/2017, 7:29 PM

## 2017-09-14 NOTE — ED Notes (Signed)
Pt states he had all his teeth pulled last week, and states having problems after that

## 2017-09-14 NOTE — ED Notes (Signed)
CRITICAL VALUE ALERT  Critical Value:  T bili - 23.4  Date & Time Notied:  09/14/17  1412   Provider Notified: Eulis Foster

## 2017-09-14 NOTE — ED Notes (Signed)
Pt requesting pain meds

## 2017-09-14 NOTE — ED Notes (Signed)
CRITICS

## 2017-09-14 NOTE — Progress Notes (Signed)
Received report form [facility] ED RN, Cecille Rubin.

## 2017-09-14 NOTE — ED Notes (Signed)
Pt only ate 25% of meal, drank tea. Pt back in bed. Hospitalist at the bedside

## 2017-09-14 NOTE — ED Notes (Signed)
Feed pt dinner tray per EDP, family at the bedside, pt will be admitted at [facility]

## 2017-09-14 NOTE — ED Notes (Signed)
CRITICAL VALUE ALERT  Critical Value:  Lactic Acid 2.89  Date & Time Notied:  1316, 09/14/17  Provider Notified: Dr. Eulis Foster  Orders Received/Actions taken: No new orders at this time.

## 2017-09-14 NOTE — ED Triage Notes (Signed)
Pt reports dizziness x 1 week and reports hematuria x 2 weeks.  Pt jaundice.

## 2017-09-14 NOTE — ED Notes (Signed)
Gone to CT

## 2017-09-14 NOTE — ED Provider Notes (Signed)
[facility] EMERGENCY DEPARTMENT Provider Note   CSN: [id number] Arrival date & time: 09/14/17  1248     History   Chief Complaint Chief Complaint  Patient presents with  . Dizziness    HPI [name] [name] is a 66 y.o. male.  He presents for evaluation of weakness and confusion which started this morning.  He states that at home this morning, he was alone, and noticed that he was "dizzy."  Apparently his sister tried to call him but could not reach him so came to his house.  She became concerned, so brought him to the emergency department.  About 1 week ago he had an intentional edentulation procedure done.  He denies bleeding, or drainage from his oral wounds.  He has noticed that he has had decreased appetite and not eaten much since that time.  He also endorses weight loss about 40 pounds in the last month.  He states that he is an ex-smoker and drinker.  He has never had hepatitis that he knows of.  He denies fever, chills, cough, focal weakness or paresthesia.  There are no other known modifying factors.    HPI  Past Medical History:  Diagnosis Date  . Bipolar 1 disorder ([place])   . Blood transfusion   . Stroke [facility])     Patient Active Problem List   Diagnosis Date Noted  . SHOULDER PAIN 10/20/2008  . NECK PAIN 10/20/2008  . BACK PAIN 10/20/2008  . HYPERLIPIDEMIA 04/23/2008  . PSA, INCREASED 04/23/2008  . MICROALBUMINURIA 04/23/2008  . VENEREAL DISEASE 04/10/2008  . DIABETES MELLITUS, TYPE II, CONTROLLED 12/09/2007  . BIPOLAR DISORDER UNSPECIFIED 12/09/2007  . TOBACCO USER 12/09/2007  . ESSENTIAL HYPERTENSION 12/09/2007  . PHARYNGITIS 12/09/2007  . OVERACTIVE BLADDER 12/09/2007    Past Surgical History:  Procedure Laterality Date  . CORONARY ARTERY BYPASS GRAFT    . KNEE ARTHROSCOPY         Home Medications    Prior to Admission medications   Medication Sig Start Date End Date Taking? Authorizing Provider  QUEtiapine (SEROQUEL XR) 400 MG 24 hr tablet  Take 400 mg by mouth at bedtime.  09/08/17  Yes [provider]  levothyroxine (SYNTHROID, LEVOTHROID) 50 MCG tablet Take 50 mcg by mouth daily. 07/24/17   [provider]  metFORMIN (GLUCOPHAGE) 500 MG tablet Take 500 mg by mouth 2 (two) times daily. 07/24/17   [provider]  pravastatin (PRAVACHOL) 40 MG tablet Take 40 mg by mouth daily. 07/24/17   [provider]  venlafaxine (EFFEXOR) 75 MG tablet Take 150 mg by mouth every morning.    [provider]    Family History No family history on file.  Social History Social History   Tobacco Use  . Smoking status: Former Smoker    Packs/day: 1.00    Years: 20.00    Pack years: 20.00    Types: Cigarettes    Last attempt to quit: 09/19/2003    Years since quitting: 13.9  . Smokeless tobacco: Never Used  Substance Use Topics  . Alcohol use: No    Comment: former  . Drug use: No     Allergies   Patient has no known allergies.   Review of Systems Review of Systems  All other systems reviewed and are negative.    Physical Exam Updated Vital Signs BP 115/78   Pulse (!) 106   Temp 98.3 F (36.8 C) (Oral)   Resp 19   Ht 6' (1.829 m)  SpO2 97%   BMI 31.19 kg/m   Physical Exam  Constitutional: He is oriented to person, place, and time. He appears well-developed and well-nourished. No distress.  He appears under nourished, and sleepy.  HENT:  Head: Normocephalic and atraumatic.  Right Ear: External ear normal.  Left Ear: External ear normal.  Eyes: Conjunctivae and EOM are normal. Pupils are equal, round, and reactive to light. Scleral icterus (Bilateral moderate) is present.  Neck: Normal range of motion and phonation normal. Neck supple.  Cardiovascular: Normal rate, regular rhythm and normal heart sounds.  Pulmonary/Chest: Effort normal and breath sounds normal. No respiratory distress. He exhibits no bony tenderness.  Abdominal: Soft. He exhibits mass (Consistent with  hepatomegaly). He exhibits no distension. There is no tenderness. There is no rebound and no guarding.  Musculoskeletal: Normal range of motion. He exhibits no edema or deformity.  Neurological: He is alert and oriented to person, place, and time. No cranial nerve deficit or sensory deficit. He exhibits normal muscle tone. Coordination normal.  Skin: Skin is warm, dry and intact.  Psychiatric: He has a normal mood and affect. His behavior is normal. Judgment and thought content normal.  Nursing note and vitals reviewed.    ED Treatments / Results  Labs (all labs ordered are listed, but only abnormal results are displayed) Labs Reviewed  COMPREHENSIVE METABOLIC PANEL - Abnormal; Notable for the following components:      Result Value   Chloride 99 (*)    Glucose, Bld 209 (*)    Creatinine, Ser 0.37 (*)    Total Protein 8.5 (*)    Albumin 2.5 (*)    AST 95 (*)    Alkaline Phosphatase 410 (*)    Total Bilirubin 23.4 (*)    All other components within normal limits  CBC WITH DIFFERENTIAL/PLATELET - Abnormal; Notable for the following components:   RBC 4.19 (*)    Hemoglobin 12.2 (*)    HCT 35.6 (*)    RDW 17.9 (*)    All other components within normal limits  PROTIME-INR - Abnormal; Notable for the following components:   Prothrombin Time 16.6 (*)    All other components within normal limits  URINALYSIS, ROUTINE W REFLEX MICROSCOPIC - Abnormal; Notable for the following components:   Color, Urine AMBER (*)    APPearance CLOUDY (*)    Glucose, UA 50 (*)    Bilirubin Urine MODERATE (*)    Protein, ur 30 (*)    Bacteria, UA MANY (*)    Squamous Epithelial / LPF 0-5 (*)    All other components within normal limits  AMMONIA - Abnormal; Notable for the following components:   Ammonia 71 (*)    All other components within normal limits  I-STAT CG4 LACTIC ACID, ED - Abnormal; Notable for the following components:   Lactic Acid, Venous 2.89 (*)    All other components within normal  limits  CULTURE, BLOOD (ROUTINE X 2)  CULTURE, BLOOD (ROUTINE X 2)  RAPID URINE DRUG SCREEN, HOSP PERFORMED  ETHANOL  I-STAT CG4 LACTIC ACID, ED    EKG  EKG Interpretation  Date/Time:  Friday September 14 2017 13:06:17 EST Ventricular Rate:  118 PR Interval:    QRS Duration: 103 QT Interval:  371 QTC Calculation: 520 R Axis:   84 Text Interpretation:  Sinus tachycardia Probable left atrial enlargement Borderline right axis deviation Borderline repolarization abnormality Prolonged QT interval Since last tracing rate faster and QT is longer Confirmed by Daleen Bo [phone]) on  09/14/2017 1:17:12 PM       Radiology Ct Abdomen Pelvis W Contrast  Result Date: 09/14/2017 CLINICAL DATA:  Hematuria for 2 weeks. EXAM: CT ABDOMEN AND PELVIS WITH CONTRAST TECHNIQUE: Multidetector CT imaging of the abdomen and pelvis was performed using the standard protocol following bolus administration of intravenous contrast. CONTRAST:  142mL ISOVUE-300 IOPAMIDOL (ISOVUE-300) INJECTION 61% COMPARISON:  None. FINDINGS: Lower chest: Multiple nodules are identified in bilateral lung bases largest in the lateral left lung base measuring 7 mm. Scarring of right lung base is identified. The heart size is normal. Hepatobiliary: Multiple heterogeneously enhancing masses are identified within the liver. There is intrahepatic biliary ductal dilatation. No gallstone is identified. Pancreas: There multiple abnormal enlarged firm enhancing lymph nodes along the superior mesenteric and celiac axis. These abnormal lymph nodes are inseparable from the posterior aspect of the head the pancreas particularly on image 38 series 2. Spleen: Normal in size without focal abnormality. Adrenals/Urinary Tract: The adrenal glands are normal. There are small simple cysts in both kidneys. There is no hydronephrosis bilaterally. The bladder is partially decompressed without gross abnormality. Stomach/Bowel: There is a thick walled stomach  particularly in the mid to distal portion extending into the duodenum. There is no small bowel obstruction or diverticulitis. Vascular/Lymphatic: Enlarged abnormal lymph nodes are identified along the celiac and superior mesenteric axis. There also smaller lymph nodes in the right lower quadrant and along the periaortic region. Atherosclerosis of the abdominal aorta is identified without aneurysmal dilatation. Reproductive: Prostate enlargement is noted. Other: Small ascites is identified in the abdomen and pelvis. Musculoskeletal: Degenerative joint changes of the spine are identified. IMPRESSION: Findings consistent with metastasis in the liver and in the lungs. Abnormal metastatic lymph nodes are also identified in the abdomen. There is thick wall stomach particularly in the mid to distal portion extending into the duodenum, neoplastic involvement is not excluded. There is also inseparable abnormal lymph nodes from the posterior aspect of had the pancreas, neoplastic involvement is not excluded. Electronically Signed   By: Abelardo Diesel M.D.   On: 09/14/2017 15:35    Procedures Procedures (including critical care time)  Medications Ordered in ED Medications  sodium chloride 0.9 % bolus 1,000 mL (0 mLs Intravenous Stopped 09/14/17 1409)  iopamidol (ISOVUE-300) 61 % injection 100 mL (100 mLs Intravenous Contrast Given 09/14/17 1522)     Initial Impression / Assessment and Plan / ED Course  I have reviewed the triage vital signs and the nursing notes.  Pertinent labs & imaging results that were available during my care of the patient were reviewed by me and considered in my medical decision making (see chart for details).  Clinical Course as of Sep 14 1754  Fri Sep 14, 2017  1351 Patient presenting with painless jaundice, hypotension, and confusion.  Initial blood pressure low improved prior to intervention.  Doubt sepsis.  Suspect decreased intake leading to low blood pressure and high heart  rate.  Evaluation to ensue.  [EW]    Clinical Course User Index [EW] Daleen Bo, MD     Patient Vitals for the past 24 hrs:  BP Temp Temp src Pulse Resp SpO2 Height  09/14/17 1600 115/78 - - (!) 106 19 97 % -  09/14/17 1530 118/77 - - (!) 106 17 98 % -  09/14/17 1500 119/75 - - (!) 107 17 97 % -  09/14/17 1430 125/86 - - (!) 108 18 98 % -  09/14/17 1400 116/74 - - (!) 104 14 97 % -  09/14/17 1330 118/81 - - (!) 109 16 96 % -  09/14/17 1300 112/80 - - (!) 125 - 95 % -  09/14/17 1253 (!) 85/64 - - - - - 6' (1.829 m)  09/14/17 1252 - 98.3 F (36.8 C) Oral (!) 126 16 96 % -   I discussed the case with Dr. Loletha Carrow, in [place], [place], gastroenterology.  He will see the patient is a consulted at: Hospital, after transfer.  He states that the patient requires admission, and evaluation for acute liver failure.   5:40 PM-Consult complete with hospitalist. Patient case explained and discussed.  She agrees to admit patient for further evaluation and treatment. Call ended at 1744 PM  5:48 PM Reevaluation with update and discussion. After initial assessment and treatment, an updated evaluation reveals patient is comfortable, has no further complaints.  Findings discussed with patient and family members, all questions answered. Daleen Bo      Final Clinical Impressions(s) / ED Diagnoses   Final diagnoses:  Acute liver failure without hepatic coma  Hyperammonemia (HCC)  Prolonged pt (prothrombin time)    Malaise with acute liver failure associated with hyperammonemia, and elevated PT.  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  He is hemodynamically stable.  He will require admission for further evaluation and treatment, urgently.  He will need to be transferred to [facility], for access to care.  Nursing Notes Reviewed/ Care Coordinated Applicable Imaging Reviewed Interpretation of Laboratory Data incorporated into ED  treatment  Plan: [place]  ED Discharge Orders    None       Daleen Bo, MD 09/14/17 1757

## 2017-09-15 ENCOUNTER — Inpatient Hospital Stay (HOSPITAL_COMMUNITY): Payer: Medicare Other | Admitting: Certified Registered Nurse Anesthetist

## 2017-09-15 ENCOUNTER — Encounter (HOSPITAL_COMMUNITY): Payer: Self-pay

## 2017-09-15 ENCOUNTER — Encounter (HOSPITAL_COMMUNITY)
Admission: EM | Disposition: A | Discharge status: Hospice | Payer: Self-pay | Source: Home / Self Care | Attending: Family Medicine

## 2017-09-15 ENCOUNTER — Inpatient Hospital Stay (HOSPITAL_COMMUNITY): Payer: Medicare Other

## 2017-09-15 DIAGNOSIS — K869 Disease of pancreas, unspecified: Secondary | ICD-10-CM

## 2017-09-15 DIAGNOSIS — D696 Thrombocytopenia, unspecified: Secondary | ICD-10-CM

## 2017-09-15 DIAGNOSIS — R17 Unspecified jaundice: Secondary | ICD-10-CM

## 2017-09-15 DIAGNOSIS — R634 Abnormal weight loss: Secondary | ICD-10-CM

## 2017-09-15 DIAGNOSIS — K838 Other specified diseases of biliary tract: Secondary | ICD-10-CM

## 2017-09-15 DIAGNOSIS — E871 Hypo-osmolality and hyponatremia: Secondary | ICD-10-CM

## 2017-09-15 HISTORY — PX: ERCP: SHX5425

## 2017-09-15 LAB — COMPREHENSIVE METABOLIC PANEL
ALBUMIN: 2.1 g/dL — AB (ref 3.5–5.0)
ALT: 52 U/L (ref 17–63)
AST: 79 U/L — AB (ref 15–41)
Alkaline Phosphatase: 347 U/L — ABNORMAL HIGH (ref 38–126)
Anion gap: 11 (ref 5–15)
BILIRUBIN TOTAL: 21.5 mg/dL — AB (ref 0.3–1.2)
BUN: 5 mg/dL — AB (ref 6–20)
CHLORIDE: 102 mmol/L (ref 101–111)
CO2: 22 mmol/L (ref 22–32)
Calcium: 8.9 mg/dL (ref 8.9–10.3)
Creatinine, Ser: 0.73 mg/dL (ref 0.61–1.24)
GFR calc Af Amer: >60 mL/min (ref >60)
GFR calc non Af Amer: >60 mL/min (ref >60)
GLUCOSE: 194 mg/dL — AB (ref 65–99)
POTASSIUM: 3.8 mmol/L (ref 3.5–5.1)
SODIUM: 135 mmol/L (ref 135–145)
TOTAL PROTEIN: 7.3 g/dL (ref 6.5–8.1)

## 2017-09-15 LAB — CBC
HEMATOCRIT: 30.3 % — AB (ref 39.0–52.0)
Hemoglobin: 10.6 g/dL — ABNORMAL LOW (ref 13.0–17.0)
MCH: 29.3 pg (ref 26.0–34.0)
MCHC: 35 g/dL (ref 30.0–36.0)
MCV: 83.7 fL (ref 78.0–100.0)
Platelets: 141 10*3/uL — ABNORMAL LOW (ref 150–400)
RBC: 3.62 MIL/uL — ABNORMAL LOW (ref 4.22–5.81)
RDW: 18.6 % — AB (ref 11.5–15.5)
WBC: 5.3 10*3/uL (ref 4.0–10.5)

## 2017-09-15 LAB — GLUCOSE, CAPILLARY
GLUCOSE-CAPILLARY: 229 mg/dL — AB (ref 65–99)
GLUCOSE-CAPILLARY: 220 mg/dL — AB (ref 65–99)
Glucose-Capillary: 164 mg/dL — ABNORMAL HIGH (ref 65–99)
Glucose-Capillary: 197 mg/dL — ABNORMAL HIGH (ref 65–99)

## 2017-09-15 LAB — HIV ANTIBODY (ROUTINE TESTING W REFLEX): HIV Screen 4th Generation wRfx: NONREACTIVE

## 2017-09-15 LAB — LACTIC ACID, PLASMA: Lactic Acid, Venous: 1.3 mmol/L (ref 0.5–1.9)

## 2017-09-15 SURGERY — ERCP, WITH INTERVENTION IF INDICATED
Anesthesia: General

## 2017-09-15 MED ORDER — IOPAMIDOL (ISOVUE-300) INJECTION 61%
INTRAVENOUS | Status: AC
  Filled 2017-09-15: qty 50

## 2017-09-15 MED ORDER — CIPROFLOXACIN IN D5W 400 MG/200ML IV SOLN
INTRAVENOUS | Status: DC | PRN
  Administered 2017-09-15: 400 mg via INTRAVENOUS

## 2017-09-15 MED ORDER — ONDANSETRON HCL 4 MG/2ML IJ SOLN
INTRAMUSCULAR | Status: DC | PRN
  Administered 2017-09-15: 4 mg via INTRAVENOUS

## 2017-09-15 MED ORDER — PROPOFOL 10 MG/ML IV BOLUS
INTRAVENOUS | Status: DC | PRN
  Administered 2017-09-15: 110 mg via INTRAVENOUS

## 2017-09-15 MED ORDER — CEFTRIAXONE SODIUM 1 G IJ SOLR
1.0000 g | Freq: Once | INTRAMUSCULAR | Status: AC
  Administered 2017-09-15: 1 g via INTRAVENOUS
  Filled 2017-09-15: qty 10

## 2017-09-15 MED ORDER — FENTANYL CITRATE (PF) 100 MCG/2ML IJ SOLN
INTRAMUSCULAR | Status: DC | PRN
  Administered 2017-09-15: 25 ug via INTRAVENOUS
  Administered 2017-09-15: 50 ug via INTRAVENOUS
  Administered 2017-09-15 (×4): 25 ug via INTRAVENOUS

## 2017-09-15 MED ORDER — GLUCERNA SHAKE PO LIQD
237.0000 mL | Freq: Two times a day (BID) | ORAL | Status: DC
  Administered 2017-09-15 – 2017-09-23 (×9): 237 mL via ORAL

## 2017-09-15 MED ORDER — SODIUM CHLORIDE 0.9 % IV SOLN
INTRAVENOUS | Status: DC | PRN
  Administered 2017-09-15: 40 mL

## 2017-09-15 MED ORDER — SODIUM CHLORIDE 0.9 % IV SOLN
INTRAVENOUS | Status: DC | PRN
  Administered 2017-09-15: 100 mL

## 2017-09-15 MED ORDER — LACTATED RINGERS IV SOLN
INTRAVENOUS | Status: DC | PRN
  Administered 2017-09-15: 09:00:00 via INTRAVENOUS

## 2017-09-15 MED ORDER — PHENYLEPHRINE HCL 10 MG/ML IJ SOLN
INTRAMUSCULAR | Status: DC | PRN
  Administered 2017-09-15: 80 ug via INTRAVENOUS

## 2017-09-15 MED ORDER — CEFTRIAXONE SODIUM 1 G IJ SOLR
1.0000 g | INTRAMUSCULAR | Status: DC
Start: 2017-09-16 — End: 2017-09-19
  Administered 2017-09-15 – 2017-09-18 (×4): 1 g via INTRAVENOUS
  Filled 2017-09-15 (×4): qty 10

## 2017-09-15 MED ORDER — SUCCINYLCHOLINE CHLORIDE 20 MG/ML IJ SOLN
INTRAMUSCULAR | Status: DC | PRN
  Administered 2017-09-15: 80 mg via INTRAVENOUS

## 2017-09-15 MED ORDER — GLUCAGON HCL RDNA (DIAGNOSTIC) 1 MG IJ SOLR
INTRAMUSCULAR | Status: AC
  Filled 2017-09-15: qty 1

## 2017-09-15 MED ORDER — CIPROFLOXACIN IN D5W 400 MG/200ML IV SOLN: 400.0000 mg | Freq: Once | INTRAVENOUS | Status: DC

## 2017-09-15 MED ORDER — SODIUM CHLORIDE 0.9 % IV SOLN: INTRAVENOUS | Status: DC

## 2017-09-15 MED ORDER — LIDOCAINE HCL (CARDIAC) 20 MG/ML IV SOLN
INTRAVENOUS | Status: DC | PRN
  Administered 2017-09-15: 60 mg via INTRAVENOUS

## 2017-09-15 MED ORDER — MIDAZOLAM HCL 5 MG/5ML IJ SOLN
INTRAMUSCULAR | Status: DC | PRN
  Administered 2017-09-15: 2 mg via INTRAVENOUS

## 2017-09-15 MED ORDER — GLUCAGON HCL RDNA (DIAGNOSTIC) 1 MG IJ SOLR
INTRAMUSCULAR | Status: DC | PRN
  Administered 2017-09-15 (×2): .5 mg via INTRAVENOUS

## 2017-09-15 NOTE — Progress Notes (Signed)
PROGRESS NOTE    [name] [name]  TKZ:[id number] DOB: 1951/03/11 DOA: 09/14/2017 PCP: Lucia Gaskins, MD    Brief Narrative:  66 y.o. male with medical history significant for  DM2, HTN, BPD, who presented to the ED with complaints of dizziness that started this morning, such that he was was unable to get up from his bed.  Also noted abdominal pain of about 2 months duration worsened over the past 2 days.  This morning patient's sisters came to visit him and noticed that his eyes were yellow  Pt was found to have a bilirubin of 23. CT scan of abdomen revealed a metastatic pancreatic tumor with a large burden of peripancreatic adenopathy and large liver masses. GI subsequently consulted who performed ERCP which included removal of stone, stent placement in the common bile duct. Recommendations were made to consult oncology   Assessment & Plan:   Principal Problem:   Abdominal pain/Obstructive jaundice - ERCP performed by gastroenterologist - continue supportive therapy - s/p stent in common bile duct  Active Problems:   DM2 (diabetes mellitus, type 2) ([place]) - SSI - Carb modified diet    BIPOLAR DISORDER UNSPECIFIED - seroquel    Essential hypertension - stable off antihypertensives   DVT prophylaxis: Place SCD's Code Status: Full Family Communication: d/c patient directly  Disposition Plan: consult oncology next am  Consultants:   GI  Procedures: ERCP  Antimicrobials: Ceftriaxone   Subjective: Patient has no new complaints currently  Objective: Vitals:   09/15/17 1124 09/15/17 1139 09/15/17 1145 09/15/17 1202  BP: 124/77 121/77  126/71  Pulse: (!) 111 (!) 113 (!) 114   Resp: 16 15 13 17   Temp:   (!) 97.5 F (36.4 C) 97.6 F (36.4 C)  TempSrc:    Axillary  SpO2: 99% 98% 97% 96%  Weight:      Height:        Intake/Output Summary (Last 24 hours) at 09/15/2017 1538 Last data filed at 09/15/2017 1537 Gross per 24 hour  Intake 1998.33 ml  Output 201 ml   Net 1797.33 ml   Filed Weights   09/14/17 2035 09/14/17 2224 09/15/17 0849  Weight: 81.6 kg (180 lb) 85.8 kg (189 lb 2.5 oz) 85.7 kg (189 lb)    Examination:  General exam: Appears calm and comfortable  Respiratory system: Clear to auscultation. Respiratory effort normal. Cardiovascular system: S1 & S2 heard, RRR.  Gastrointestinal system: Abdomen is nondistended, soft and nontender.  Central nervous system: Alert and oriented. No focal neurological deficits. Extremities: Warm, no deformities Skin: No rashes, lesions or ulcers, on limited exam  Psychiatry: Mood & affect appropriate.     Data Reviewed: I have personally reviewed following labs and imaging studies  CBC: Recent Labs  Lab 09/14/17 1255 09/15/17 0223  WBC 5.4 5.3  NEUTROABS 3.1  --   HGB 12.2* 10.6*  HCT 35.6* 30.3*  MCV 85.0 83.7  PLT 155 573*   Basic Metabolic Panel: Recent Labs  Lab 09/14/17 1255 09/15/17 0223  NA 136 135  K 3.8 3.8  CL 99* 102  CO2 23 22  GLUCOSE 209* 194*  BUN 10 5*  CREATININE 0.37* 0.73  CALCIUM 9.6 8.9   GFR: Estimated Creatinine Clearance: 101 mL/min (by C-G formula based on SCr of 0.73 mg/dL). Liver Function Tests: Recent Labs  Lab 09/14/17 1255 09/14/17 2022 09/15/17 0223  AST 95*  --  79*  ALT 59  --  52  ALKPHOS 410*  --  347*  BILITOT  23.4* 23.0* 21.5*  PROT 8.5*  --  7.3  ALBUMIN 2.5*  --  2.1*   No results for input(s): LIPASE, AMYLASE in the last 168 hours. Recent Labs  Lab 09/14/17 1256  AMMONIA 71*   Coagulation Profile: Recent Labs  Lab 09/14/17 1255  INR 1.35   Cardiac Enzymes: No results for input(s): CKTOTAL, CKMB, CKMBINDEX, TROPONINI in the last 168 hours. BNP (last 3 results) No results for input(s): PROBNP in the last 8760 hours. HbA1C: No results for input(s): HGBA1C in the last 72 hours. CBG: Recent Labs  Lab 09/15/17 0745 09/15/17 1212  GLUCAP 164* 197*   Lipid Profile: No results for input(s): CHOL, HDL, LDLCALC,  TRIG, CHOLHDL, LDLDIRECT in the last 72 hours. Thyroid Function Tests: No results for input(s): TSH, T4TOTAL, FREET4, T3FREE, THYROIDAB in the last 72 hours. Anemia Panel: No results for input(s): VITAMINB12, FOLATE, FERRITIN, TIBC, IRON, RETICCTPCT in the last 72 hours. Sepsis Labs: Recent Labs  Lab 09/14/17 1321 09/14/17 2038 09/15/17 0223  LATICACIDVEN 2.89* 2.63* 1.3    Recent Results (from the past 240 hour(s))  Culture, blood (Routine x 2)     Status: None (Preliminary result)   Collection Time: 09/14/17 12:55 PM  Result Value Ref Range Status   Specimen Description   Final    BLOOD RIGHT HAND BOTTLES DRAWN AEROBIC AND ANAEROBIC   Special Requests Blood Culture adequate volume  Final   Culture NO GROWTH < 24 HOURS  Final   Report Status PENDING  Incomplete  Culture, blood (Routine x 2)     Status: None (Preliminary result)   Collection Time: 09/14/17 12:55 PM  Result Value Ref Range Status   Specimen Description   Final    LEFT ANTECUBITAL BOTTLES DRAWN AEROBIC AND ANAEROBIC   Special Requests   Final    Blood Culture results may not be optimal due to an excessive volume of blood received in culture bottles   Culture NO GROWTH < 24 HOURS  Final   Report Status PENDING  Incomplete     Radiology Studies: Ct Abdomen Pelvis W Contrast  Result Date: 09/14/2017 CLINICAL DATA:  Hematuria for 2 weeks. EXAM: CT ABDOMEN AND PELVIS WITH CONTRAST TECHNIQUE: Multidetector CT imaging of the abdomen and pelvis was performed using the standard protocol following bolus administration of intravenous contrast. CONTRAST:  162mL ISOVUE-300 IOPAMIDOL (ISOVUE-300) INJECTION 61% COMPARISON:  None. FINDINGS: Lower chest: Multiple nodules are identified in bilateral lung bases largest in the lateral left lung base measuring 7 mm. Scarring of right lung base is identified. The heart size is normal. Hepatobiliary: Multiple heterogeneously enhancing masses are identified within the liver. There is  intrahepatic biliary ductal dilatation. No gallstone is identified. Pancreas: There multiple abnormal enlarged firm enhancing lymph nodes along the superior mesenteric and celiac axis. These abnormal lymph nodes are inseparable from the posterior aspect of the head the pancreas particularly on image 38 series 2. Spleen: Normal in size without focal abnormality. Adrenals/Urinary Tract: The adrenal glands are normal. There are small simple cysts in both kidneys. There is no hydronephrosis bilaterally. The bladder is partially decompressed without gross abnormality. Stomach/Bowel: There is a thick walled stomach particularly in the mid to distal portion extending into the duodenum. There is no small bowel obstruction or diverticulitis. Vascular/Lymphatic: Enlarged abnormal lymph nodes are identified along the celiac and superior mesenteric axis. There also smaller lymph nodes in the right lower quadrant and along the periaortic region. Atherosclerosis of the abdominal aorta is identified without aneurysmal  dilatation. Reproductive: Prostate enlargement is noted. Other: Small ascites is identified in the abdomen and pelvis. Musculoskeletal: Degenerative joint changes of the spine are identified. IMPRESSION: Findings consistent with metastasis in the liver and in the lungs. Abnormal metastatic lymph nodes are also identified in the abdomen. There is thick wall stomach particularly in the mid to distal portion extending into the duodenum, neoplastic involvement is not excluded. There is also inseparable abnormal lymph nodes from the posterior aspect of had the pancreas, neoplastic involvement is not excluded. Electronically Signed   By: Abelardo Diesel M.D.   On: 09/14/2017 15:35   Dg Ercp Biliary & Pancreatic Ducts  Result Date: 09/15/2017 CLINICAL DATA:  Gallstones EXAM: ERCP TECHNIQUE: Multiple spot images obtained with the fluoroscopic device and submitted for interpretation post-procedure. FLUOROSCOPY TIME:   Fluoroscopy Time:  7 minutes and 25 seconds Radiation Exposure Index (if provided by the fluoroscopic device): Number of Acquired Spot Images: 0 COMPARISON:  None. FINDINGS: Images demonstrate cannulation of the common bile duct and contrast filling the biliary tree. A biliary stent has been placed on the final image. Balloon stone retrieval is noted. There is narrowing of the upper common bile duct of unknown significance. IMPRESSION: See above. These images were submitted for radiologic interpretation only. Please see the procedural report for the amount of contrast and the fluoroscopy time utilized. Electronically Signed   By: Marybelle Killings M.D.   On: 09/15/2017 12:30    Scheduled Meds: . feeding supplement (GLUCERNA SHAKE)  237 mL Oral BID BM  . Influenza vac split quadrivalent PF  0.5 mL Intramuscular Tomorrow-1000  . insulin aspart  0-9 Units Subcutaneous TID WC  . pneumococcal 23 valent vaccine  0.5 mL Intramuscular Tomorrow-1000  . polyethylene glycol  17 g Oral Daily  . QUEtiapine  400 mg Oral QHS   Continuous Infusions: . [START ON 09/16/2017] cefTRIAXone (ROCEPHIN)  IV       LOS: 1 day    Time spent: > 35 minutes  Velvet Bathe, MD [facility] Pager [phone]  If 7PM-7AM, please contact night-coverage [email or web address] Password [facility] 09/15/2017, 3:38 PM

## 2017-09-15 NOTE — Consult Note (Addendum)
[facility] Consult Note   History [name] [name] MRN # [id number]  Date of Admission: 09/14/2017 Date of Consultation: 09/15/2017 Referring physician: Dr. Velvet Bathe, MD Primary Care Provider: Lucia Gaskins, MD Primary Gastroenterologist: None  He was transferred here yesterday from [facility] because there was no gastroenterology service available there this weekend.  Reason for Consultation/Chief Complaint: obstructive Jaundice  Subjective  HPI:  This is a 66 year old man who [facility] progressive malaise, loss of appetite and dizziness.  He was noted to be deeply jaundiced and icteric, which the patient had not previously noticed.  He was found to have a bilirubin of 23, and a CT scan of the abdomen revealed a metastatic pancreatic tumor with a large burden of peripancreatic adenopathy and multiple large liver masses.  He has biliary ductal dilatation as well. Patient is a fairly limited historian, he just reports some recent loss of appetite and fatigue.  He believes he may have lost 30 or 40 pounds in the last couple of months, but he attributed some of that to having electively had all of his teeth removed about a month ago. He has some vague upper abdominal discomfort, anorexia, nausea but no vomiting, denies dysphagia chest pain or dyspnea.  ROS:  All other systems are negative except as noted above in the HPI  Past Medical History Past Medical History:  Diagnosis Date  . Bipolar 1 disorder ([place])   . Blood transfusion   . Stroke [facility])     Past Surgical History Past Surgical History:  Procedure Laterality Date  . CORONARY ARTERY BYPASS GRAFT    . KNEE ARTHROSCOPY      Family History No family history on file.  Social History Social History   Socioeconomic History  . Marital status: Divorced    Spouse name: None  . Number of children: None  . Years of education: None  . Highest  education level: None  Social Needs  . Financial resource strain: None  . Food insecurity - worry: None  . Food insecurity - inability: None  . Transportation needs - medical: None  . Transportation needs - non-medical: None  Occupational History  . None  Tobacco Use  . Smoking status: Former Smoker    Packs/day: 1.00    Years: 20.00    Pack years: 20.00    Types: Cigarettes    Last attempt to quit: 09/19/2003    Years since quitting: 14.0  . Smokeless tobacco: Never Used  Substance and Sexual Activity  . Alcohol use: No    Comment: former  . Drug use: No  . Sexual activity: None  Other Topics Concern  . None  Social History Narrative  . None   He has a sister in the area, and lives home alone in [place] No Known Allergies  Outpatient Meds Home medications from the H+P and/or nursing med reconciliation reviewed.  Inpatient med list reviewed  ________________________ he is on no antiplatelet or anticoagulant agents _____________________________________________ Objective   Exam:  Current vital signs  Patient Vitals for the past 8 hrs:  BP Temp Pulse Resp SpO2  09/15/17 0504 119/79 98.2 F (36.8 C) (!) 110 20 97 %    Intake/Output Summary (Last 24 hours) at 09/15/2017 6237 Last data filed at 09/15/2017 0430 Gross per 24 hour  Intake 1838.33 ml  Output 200 ml  Net 1638.33 ml    Physical Exam:    General: this is  a deeply jaundiced male patient in no acute distress with a flat affect  Eyes: sclera deeply icteric, no redness  ENT: oral mucosa moist without lesions, no cervical or supraclavicular lymphadenopathy, edentulous  CV: RRR without murmur, S1/S2, no JVD,, no peripheral edema  Resp: clear to auscultation bilaterally, normal RR and effort noted  GI: soft, mild epigastric tenderness, with active bowel sounds. No guarding or palpable organomegaly noted  Skin; warm and dry, no rash or jaundice noted  Neuro: awake, alert and  oriented x 3. Normal gross motor function and fluent speech.  Labs:  Recent Labs  Lab 09/14/17 1255 09/15/17 0223  WBC 5.4 5.3  HGB 12.2* 10.6*  HCT 35.6* 30.3*  PLT 155 141*   Recent Labs  Lab 09/15/17 0223  NA 135  K 3.8  CL 102  CO2 22  BUN 5*  ALBUMIN 2.1*  ALKPHOS 347*  ALT 52  AST 79*  GLUCOSE 194*  Total bilirubin 21 Recent Labs  Lab 09/14/17 1255  INR 1.35    Radiologic studies:  CT scan abdomen and pelvis with contrast from yesterday was personally reviewed with findings as noted above.  He also has some bibasilar lung nodules.  @ASSESSMENTPLANBEGIN @ Impression:  Obstructive jaundice Pancreatic mass, almost certainly malignant and metastatic Weight loss   Plan:  ERCP today. The procedure was described in detail in the presence of his nurse with diagrams of the anatomy shown to help illustrate the procedure and its risks.  He is agreeable.  The benefits and risks of the planned procedure were described in detail with the patient or (when appropriate) their health care proxy.  Risks were outlined as including, but not limited to, bleeding, infection, perforation, adverse medication reaction leading to cardiac or pulmonary decompensation, or pancreatitis (if ERCP).  The limitation of incomplete mucosal visualization was also discussed.  No guarantees or warranties were given. Patient at increased risk for cardiopulmonary complications of procedure due to medical comorbidities.   Although he does not currently have signs of cholangitis, he needs to have relief of the biliary obstruction today or tomorrow because of the risk of cholangitis with a high-grade biliary obstruction.  If the procedure is unsuccessful because his bile duct is completely obstructed, he will need a percutaneous cholangiogram with internal and external drain by interventional radiology.   Oncology consult to follow  CA 19-9 ordered with tomorrow morning labs  Thank you for the  courtesy of this consult.  Please contact me with any questions or concerns.  [name] Pager: [phone] Mon-Fri 8a-5p [phone] after 5p, weekends, holidays

## 2017-09-15 NOTE — H&P (View-Only) (Signed)
[facility] Consult Note   History [name] [name] MRN # [id number]  Date of Admission: 09/14/2017 Date of Consultation: 09/15/2017 Referring physician: Dr. Velvet Bathe, MD Primary Care Provider: Lucia Gaskins, MD Primary Gastroenterologist: None  He was transferred here yesterday from [facility] because there was no gastroenterology service available there this weekend.  Reason for Consultation/Chief Complaint: obstructive Jaundice  Subjective  HPI:  This is a 66 year old man who [facility] for progressive malaise, loss of appetite and dizziness.  He was noted to be deeply jaundiced and icteric, which the patient had not previously noticed.  He was found to have a bilirubin of 23, and a CT scan of the abdomen revealed a metastatic pancreatic tumor with a large burden of peripancreatic adenopathy and multiple large liver masses.  He has biliary ductal dilatation as well. Patient is a fairly limited historian, he just reports some recent loss of appetite and fatigue.  He believes he may have lost 30 or 40 pounds in the last couple of months, but he attributed some of that to having electively had all of his teeth removed about a month ago. He has some vague upper abdominal discomfort, anorexia, nausea but no vomiting, denies dysphagia chest pain or dyspnea.  ROS:  All other systems are negative except as noted above in the HPI  Past Medical History Past Medical History:  Diagnosis Date  . Bipolar 1 disorder ([place])   . Blood transfusion   . Stroke [facility])     Past Surgical History Past Surgical History:  Procedure Laterality Date  . CORONARY ARTERY BYPASS GRAFT    . KNEE ARTHROSCOPY      Family History No family history on file.  Social History Social History   Socioeconomic History  . Marital status: Divorced    Spouse name: None  . Number of children: None  . Years of education: None  . Highest  education level: None  Social Needs  . Financial resource strain: None  . Food insecurity - worry: None  . Food insecurity - inability: None  . Transportation needs - medical: None  . Transportation needs - non-medical: None  Occupational History  . None  Tobacco Use  . Smoking status: Former Smoker    Packs/day: 1.00    Years: 20.00    Pack years: 20.00    Types: Cigarettes    Last attempt to quit: 09/19/2003    Years since quitting: 14.0  . Smokeless tobacco: Never Used  Substance and Sexual Activity  . Alcohol use: No    Comment: former  . Drug use: No  . Sexual activity: None  Other Topics Concern  . None  Social History Narrative  . None   He has a sister in the area, and lives home alone in [place] No Known Allergies  Outpatient Meds Home medications from the H+P and/or nursing med reconciliation reviewed.  Inpatient med list reviewed  ________________________ he is on no antiplatelet or anticoagulant agents _____________________________________________ Objective   Exam:  Current vital signs  Patient Vitals for the past 8 hrs:  BP Temp Pulse Resp SpO2  09/15/17 0504 119/79 98.2 F (36.8 C) (!) 110 20 97 %    Intake/Output Summary (Last 24 hours) at 09/15/2017 8502 Last data filed at 09/15/2017 0430 Gross per 24 hour  Intake 1838.33 ml  Output 200 ml  Net 1638.33 ml    Physical Exam:    General: this is  a deeply jaundiced male patient in no acute distress with a flat affect  Eyes: sclera deeply icteric, no redness  ENT: oral mucosa moist without lesions, no cervical or supraclavicular lymphadenopathy, edentulous  CV: RRR without murmur, S1/S2, no JVD,, no peripheral edema  Resp: clear to auscultation bilaterally, normal RR and effort noted  GI: soft, mild epigastric tenderness, with active bowel sounds. No guarding or palpable organomegaly noted  Skin; warm and dry, no rash or jaundice noted  Neuro: awake, alert and  oriented x 3. Normal gross motor function and fluent speech.  Labs:  Recent Labs  Lab 09/14/17 1255 09/15/17 0223  WBC 5.4 5.3  HGB 12.2* 10.6*  HCT 35.6* 30.3*  PLT 155 141*   Recent Labs  Lab 09/15/17 0223  NA 135  K 3.8  CL 102  CO2 22  BUN 5*  ALBUMIN 2.1*  ALKPHOS 347*  ALT 52  AST 79*  GLUCOSE 194*  Total bilirubin 21 Recent Labs  Lab 09/14/17 1255  INR 1.35    Radiologic studies:  CT scan abdomen and pelvis with contrast from yesterday was personally reviewed with findings as noted above.  He also has some bibasilar lung nodules.  @ASSESSMENTPLANBEGIN @ Impression:  Obstructive jaundice Pancreatic mass, almost certainly malignant and metastatic Weight loss   Plan:  ERCP today. The procedure was described in detail in the presence of his nurse with diagrams of the anatomy shown to help illustrate the procedure and its risks.  He is agreeable.  The benefits and risks of the planned procedure were described in detail with the patient or (when appropriate) their health care proxy.  Risks were outlined as including, but not limited to, bleeding, infection, perforation, adverse medication reaction leading to cardiac or pulmonary decompensation, or pancreatitis (if ERCP).  The limitation of incomplete mucosal visualization was also discussed.  No guarantees or warranties were given. Patient at increased risk for cardiopulmonary complications of procedure due to medical comorbidities.   Although he does not currently have signs of cholangitis, he needs to have relief of the biliary obstruction today or tomorrow because of the risk of cholangitis with a high-grade biliary obstruction.  If the procedure is unsuccessful because his bile duct is completely obstructed, he will need a percutaneous cholangiogram with internal and external drain by interventional radiology.   Oncology consult to follow  CA 19-9 ordered with tomorrow morning labs  Thank you for the  courtesy of this consult.  Please contact me with any questions or concerns.  [name] Pager: [phone] Mon-Fri 8a-5p [phone] after 5p, weekends, holidays

## 2017-09-15 NOTE — Progress Notes (Signed)
Patient going to Endo for ERCP. Alert and oriented, no complaints. Will continue to monitor.

## 2017-09-15 NOTE — Progress Notes (Signed)
Patient back from Endo; alert and verbally responsive, drowsy; no complaints of pain or other discomfort. Will continue to monitor.

## 2017-09-15 NOTE — Anesthesia Postprocedure Evaluation (Signed)
Anesthesia Post Note  Patient: TERUO STILLEY  Procedure(s) Performed: ENDOSCOPIC RETROGRADE CHOLANGIOPANCREATOGRAPHY (ERCP) (N/A )     Patient location during evaluation: PACU Anesthesia Type: General Level of consciousness: awake and alert Pain management: pain level controlled Vital Signs Assessment: post-procedure vital signs reviewed and stable Respiratory status: spontaneous breathing, nonlabored ventilation, respiratory function stable and patient connected to nasal cannula oxygen Cardiovascular status: blood pressure returned to baseline and stable Postop Assessment: no apparent nausea or vomiting Anesthetic complications: no    Last Vitals:  Vitals:   09/15/17 1145 09/15/17 1202  BP:  126/71  Pulse: (!) 114   Resp: 13 17  Temp: (!) 36.4 C 36.4 C  SpO2: 97% 96%    Last Pain:  Vitals:   09/15/17 1202  TempSrc: Axillary  PainSc:                  Kloie Whiting

## 2017-09-15 NOTE — Interval H&P Note (Signed)
History and Physical Interval Note:  09/15/2017 9:00 AM  [name] [name]  has presented today for surgery, with the diagnosis of jaundice  The various methods of treatment have been discussed with the patient and family. After consideration of risks, benefits and other options for treatment, the patient has consented to  Procedure(s): ENDOSCOPIC RETROGRADE CHOLANGIOPANCREATOGRAPHY (ERCP) (N/A) as a surgical intervention .  The patient's history has been reviewed, patient examined, no change in status, stable for surgery.  I have reviewed the patient's chart and labs.  Questions were answered to the patient's satisfaction.     [name]

## 2017-09-15 NOTE — Anesthesia Procedure Notes (Signed)
Procedure Name: Intubation Date/Time: 09/15/2017 9:32 AM Performed by: Clearnce Sorrel, CRNA Pre-anesthesia Checklist: Patient identified, Emergency Drugs available, Suction available, Patient being monitored and Timeout performed Patient Re-evaluated:Patient Re-evaluated prior to induction Oxygen Delivery Method: Circle system utilized Preoxygenation: Pre-oxygenation with 100% oxygen Induction Type: IV induction Ventilation: Mask ventilation without difficulty Laryngoscope Size: Mac and 3 Grade View: Grade II Tube size: 7.0 mm Number of attempts: 1 Airway Equipment and Method: Stylet Placement Confirmation: ETT inserted through vocal cords under direct vision,  positive ETCO2 and breath sounds checked- equal and bilateral Secured at: 23 cm Tube secured with: Tape Dental Injury: Teeth and Oropharynx as per pre-operative assessment

## 2017-09-15 NOTE — Progress Notes (Signed)
NURSING PROGRESS NOTE  [name] [name] [id number] Admission Data: 09/15/2017 1:32 AM Attending Provider: Bethena Roys, MD POE:[id number], Delfino Lovett, MD Code Status: FULL Allergies:  Patient has no known allergies. Past Medical History:   has a past medical history of Bipolar 1 disorder ([place]), Blood transfusion, and Stroke ([place]). Past Surgical History:   has a past surgical history that includes Coronary artery bypass graft and Knee arthroscopy. Social History:   reports that he quit smoking about 14 years ago. His smoking use included cigarettes. He has a 20.00 pack-year smoking history. he has never used smokeless tobacco. He reports that he does not drink alcohol or use drugs.  [name] [name] is a 66 y.o. male patient admitted from [facility] ED:   Last Documented Vital Signs: Blood pressure 135/80, pulse (!) 108, temperature 97.8 F (36.6 C), resp. rate 20, height 6' (1.829 m), weight 85.8 kg (189 lb 2.5 oz), SpO2 99 %.  Cardiac Monitoring: Box # 22 in place. Cardiac monitor yields:sinus tachycardia.  IV Fluids:  IV in place, occlusive dsg intact without redness, IV cath hand right, condition patent and no redness Normal Saline with 20 meq KCl  Skin: Appropriate for ethnicity with healing abrasion on left thigh.  Patient orientated to room. Information packet given to patient. Admission inpatient armband information verified with patient to include name and date of birth and placed on patient arm. Side rails up x 2, fall assessment and education completed with patient. Patient able to verbalize understanding of risk associated with falls and verbalized understanding to call for assistance before getting out of bed. Call light within reach. Patient able to voice and demonstrate understanding of unit orientation instructions.    Will continue to evaluate and treat per MD orders.  Clydell Hakim RN, BSN

## 2017-09-15 NOTE — Progress Notes (Signed)
CRITICAL VALUE ALERT  Critical Value:  total bilirubin  21.5  Date & Time Notied:  09/15/17 1460  Provider Notified: NP X. Blount  Orders Received/Actions taken: No new orders; will continue to monitor.

## 2017-09-15 NOTE — Anesthesia Preprocedure Evaluation (Signed)
Anesthesia Evaluation  Patient identified by MRN, date of birth, ID band Patient awake    Reviewed: Allergy & Precautions, NPO status , Patient's Chart, lab work & pertinent test results  History of Anesthesia Complications Negative for: history of anesthetic complications  Airway Mallampati: II  TM Distance: >3 FB Neck ROM: Full    Dental  (+) Edentulous Upper, Edentulous Lower   Pulmonary former smoker,    breath sounds clear to auscultation       Cardiovascular hypertension, (-) angina(-) Past MI and (-) CHF  Rhythm:Regular     Neuro/Psych PSYCHIATRIC DISORDERS  Neuromuscular disease CVA, No Residual Symptoms    GI/Hepatic negative GI ROS, Obstructive jaundice   Endo/Other  diabetes  Renal/GU negative Renal ROS     Musculoskeletal   Abdominal   Peds  Hematology  (+) anemia ,   Anesthesia Other Findings   Reproductive/Obstetrics                             Anesthesia Physical Anesthesia Plan  ASA: III  Anesthesia Plan: General   Post-op Pain Management:    Induction: Intravenous  PONV Risk Score and Plan: 2 and Ondansetron and Treatment may vary due to age or medical condition  Airway Management Planned: Oral ETT  Additional Equipment: None  Intra-op Plan:   Post-operative Plan: Extubation in OR  Informed Consent: I have reviewed the patients History and Physical, chart, labs and discussed the procedure including the risks, benefits and alternatives for the proposed anesthesia with the patient or authorized representative who has indicated his/her understanding and acceptance.   Dental advisory given  Plan Discussed with: CRNA and Surgeon  Anesthesia Plan Comments:         Anesthesia Quick Evaluation

## 2017-09-15 NOTE — Op Note (Addendum)
[facility] Patient Name: [name] [name] Procedure Date : 09/15/2017 MRN: [id number] Attending MD: Estill Cotta. Loletha Carrow , MD Date of Birth: 1951-09-09 CSN: [id number] Age: 66 Admit Type: Inpatient Procedure:                ERCP Indications:              Jaundice, Malignant tumor of the head of pancreas Providers:                Mallie Mussel L. Loletha Carrow, MD, Laverta Baltimore RN, RN, Cletis Athens, Technician, Clearnce Sorrel, CRNA Referring MD:              Medicines:                General Anesthesia, Indomethacin 100 mg PR, Cipro                            400 mg IV, Rocephin 1 g IV Complications:            No immediate complications. Estimated Blood Loss:     Estimated blood loss: none. Procedure:                Pre-Anesthesia Assessment:                           - Prior to the procedure, a History and Physical                            was performed, and patient medications and                            allergies were reviewed. The patient's tolerance of                            previous anesthesia was also reviewed. The risks                            and benefits of the procedure and the sedation                            options and risks were discussed with the patient.                            All questions were answered, and informed consent                            was obtained. Prior Anticoagulants: The patient has                            taken no previous anticoagulant or antiplatelet                            agents. ASA Grade Assessment: III - A patient with  severe systemic disease. After reviewing the risks                            and benefits, the patient was deemed in                            satisfactory condition to undergo the procedure.                           After obtaining informed consent, the scope was                            passed under direct vision. Throughout the         procedure, the patient's blood pressure, pulse, and                            oxygen saturations were monitored continuously. The                            [id number] scope was introduced through the                            mouth, and used to inject contrast into and used to                            inject contrast into the bile duct. The ERCP was                            accomplished without difficulty. The patient                            tolerated the procedure well. Scope In: Scope Out: Findings:      The scout film was normal. The esophagus was successfully intubated       under direct vision. The scope was advanced to a normal major papilla in       the descending duodenum without detailed examination of the pharynx,       larynx and associated structures, and upper GI tract. The upper GI tract       was grossly normal. The bile duct was deeply cannulated with the       traction (standard) sphincterotome. Contrast was injected. I personally       interpreted the bile duct images. Ductal flow of contrast was adequate.       Image quality was excellent. Contrast extended to the hepatic ducts. The       middle third of the main bile duct contained a single severe stenosis 2       mm in length. The upper third of the main bile duct contained one stone,       which was 6 mm in diameter. 0.035 inch x 260 cm straight Hydra Jagwire       was passed into the biliary tree. A 6 mm biliary sphincterotomy was made       with a traction (standard) sphincterotome using blended current. There       was no post-sphincterotomy bleeding.  Because the initial cholangiogram was suboptimal, a 9 mm biliary balloon       was passed into the distal CBD and inflated and then contrast       injected.This allowed for much better cholangiogram and visualization of       the stricture and stone noted above. No balloon sweep was performed. It       was clear that the stone in the  proximal CBD could not be removed       because of the stricture, thus the decision was made to place a stent.       Cells for cytology were obtained by brushing the stricture. One 7Fr x 9       cm plastic stent was placed in the bile duct, but the entire stent could       not be passed into the duct due to the stricture and stone, with 3 cm of       the stent still in the duodenum. Therefore, that stent was removed with       a snare. One 7 Fr by 7 cm plastic stent with a single external flap and       a single internal flap was placed 6 cm into the common bile duct. The       stent was in good position. The total fluoroscopy exposure time was 7       minutes and 25 seconds. 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                           - Choledocholithiasis was found. Removal by biliary                            sphincterotomy was not accomplished; a stent was                            inserted.                           - A biliary sphincterotomy was performed.                           - One plastic stent was placed into the common bile                            duct. Recommendation:           - Resume regular diet.                           - Oncology consult                           Follow LFTs, which are not likely to normalize due                            to the presence of significant hepatic metastasis. Procedure Code(s):        --- Professional ---  41324, Endoscopic retrograde                            cholangiopancreatography (ERCP); with placement of                            endoscopic stent into biliary or pancreatic duct,                            including pre- and post-dilation and guide wire                            passage, when performed, including sphincterotomy,                            when performed, each stent Diagnosis Code(s):        --- Professional ---                           K80.51,  Calculus of bile duct without cholangitis                            or cholecystitis with obstruction                           R17, Unspecified jaundice                           C25.0, Malignant neoplasm of head of pancreas CPT copyright 2016 [employer]. All rights reserved. The codes documented in this report are preliminary and upon coder review may  be revised to meet current compliance requirements. Brodyn Depuy L. Loletha Carrow, MD 09/15/2017 11:19:41 AM This report has been signed electronically. Number of Addenda: 0

## 2017-09-15 NOTE — Transfer of Care (Signed)
Immediate Anesthesia Transfer of Care Note  Patient: [name] [name]  Procedure(s) Performed: ENDOSCOPIC RETROGRADE CHOLANGIOPANCREATOGRAPHY (ERCP) (N/A )  Patient Location: PACU  Anesthesia Type:General  Level of Consciousness: awake and patient cooperative  Airway & Oxygen Therapy: Patient Spontanous Breathing and Patient connected to nasal cannula oxygen  Post-op Assessment: Report given to RN and Post -op Vital signs reviewed and stable  Post vital signs: Reviewed and stable  Last Vitals:  Vitals:   09/15/17 0849 09/15/17 1109  BP: 121/69 112/82  Pulse: (!) 110 (!) 108  Resp: (!) 22 15  Temp: 36.9 C   SpO2: 98% 100%    Last Pain:  Vitals:   09/15/17 0925  TempSrc:   PainSc: 0-No pain      Patients Stated Pain Goal: 5 (43/56/86 1683)  Complications: No apparent anesthesia complications

## 2017-09-16 ENCOUNTER — Encounter (HOSPITAL_COMMUNITY): Payer: Self-pay | Admitting: Gastroenterology

## 2017-09-16 DIAGNOSIS — R1010 Upper abdominal pain, unspecified: Secondary | ICD-10-CM

## 2017-09-16 DIAGNOSIS — C787 Secondary malignant neoplasm of liver and intrahepatic bile duct: Secondary | ICD-10-CM

## 2017-09-16 DIAGNOSIS — R630 Anorexia: Secondary | ICD-10-CM

## 2017-09-16 LAB — COMPREHENSIVE METABOLIC PANEL
ALT: 52 U/L (ref 17–63)
ANION GAP: 11 (ref 5–15)
AST: 90 U/L — ABNORMAL HIGH (ref 15–41)
Albumin: 2.1 g/dL — ABNORMAL LOW (ref 3.5–5.0)
Alkaline Phosphatase: 326 U/L — ABNORMAL HIGH (ref 38–126)
BUN: 13 mg/dL (ref 6–20)
CHLORIDE: 103 mmol/L (ref 101–111)
CO2: 20 mmol/L — AB (ref 22–32)
CREATININE: 1.88 mg/dL — AB (ref 0.61–1.24)
Calcium: 9 mg/dL (ref 8.9–10.3)
GFR, EST AFRICAN AMERICAN: 42 mL/min — AB (ref >60)
GFR, EST NON AFRICAN AMERICAN: 36 mL/min — AB (ref >60)
Glucose, Bld: 181 mg/dL — ABNORMAL HIGH (ref 65–99)
POTASSIUM: 3.9 mmol/L (ref 3.5–5.1)
SODIUM: 134 mmol/L — AB (ref 135–145)
Total Bilirubin: 22.5 mg/dL (ref 0.3–1.2)
Total Protein: 7.1 g/dL (ref 6.5–8.1)

## 2017-09-16 LAB — GLUCOSE, CAPILLARY
GLUCOSE-CAPILLARY: 173 mg/dL — AB (ref 65–99)
GLUCOSE-CAPILLARY: 230 mg/dL — AB (ref 65–99)
GLUCOSE-CAPILLARY: 203 mg/dL — AB (ref 65–99)
GLUCOSE-CAPILLARY: 198 mg/dL — AB (ref 65–99)

## 2017-09-16 LAB — LIPASE, BLOOD: LIPASE: 45 U/L (ref 11–51)

## 2017-09-16 MED ORDER — LACTATED RINGERS IV SOLN
INTRAVENOUS | Status: DC
  Administered 2017-09-16 (×2): via INTRAVENOUS
  Administered 2017-09-17: 1000 mL via INTRAVENOUS
  Administered 2017-09-17: 06:00:00 via INTRAVENOUS

## 2017-09-16 MED ORDER — BOOST / RESOURCE BREEZE PO LIQD CUSTOM
1.0000 | Freq: Three times a day (TID) | ORAL | Status: DC
  Administered 2017-09-16 – 2017-09-18 (×6): 1 via ORAL

## 2017-09-16 NOTE — Progress Notes (Signed)
Hematology oncology I met with the patient briefly and discussed with him that there is a strong clinical suspicion that he has metastatic pancreatic cancer with liver metastases. ERCP was performed and stent was placed.  I was informed that a biopsy was also performed during this procedure although I cannot confirm that based upon the notes that I can see in the system. I will arrange an outpatient follow-up for the patient to see either Dr. Learta Codding or Dr. Burr Medico. Patient understands and is agreeable with the plan. Thanks

## 2017-09-16 NOTE — Progress Notes (Signed)
PROGRESS NOTE    [name] [name]  YCX:[id number] DOB: November 02, 1950 DOA: 09/14/2017 PCP: Lucia Gaskins, MD    Brief Narrative:  66 y.o. male with medical history significant for  DM2, HTN, BPD, who presented to the ED with complaints of dizziness that started this morning, such that he was was unable to get up from his bed.  Also noted abdominal pain of about 2 months duration worsened over the past 2 days.  This morning patient's sisters came to visit him and noticed that his eyes were yellow  Pt was found to have a bilirubin of 23. CT scan of abdomen revealed a metastatic pancreatic tumor with a large burden of peripancreatic adenopathy and large liver masses. GI subsequently consulted who performed ERCP which included removal of stone, stent placement in the common bile duct. Recommendations were made to consult oncology   Assessment & Plan:   Principal Problem:   Abdominal pain/Obstructive jaundice - ERCP performed by gastroenterologist - continue supportive therapy - s/p stent in common bile duct - I have consulted Oncology who will see patient next am 09/17/17, no tissue biopsy obtained. Will place order for IR to evaluate to see if they may be obtained the percutanous route - cytology pending.  Active Problems:   DM2 (diabetes mellitus, type 2) ([place]) - SSI - Carb modified diet    BIPOLAR DISORDER UNSPECIFIED - seroquel    Essential hypertension - stable off antihypertensives  UTI - started on Rocephin - c/u urine cultures   DVT prophylaxis: Place SCD's Code Status: Full Family Communication: d/c patient directly  Disposition Plan:   Consultants:   GI  Oncology  Procedures: ERCP  Antimicrobials: Ceftriaxone   Subjective: No new complaints reported to me  Objective: Vitals:   09/15/17 1202 09/15/17 2050 09/16/17 0553 09/16/17 1438  BP: 126/71 125/64 113/67 101/66  Pulse:  (!) 110 (!) 112 (!) 117  Resp: 17 18 18    Temp: 97.6 F (36.4 C) 97.9 F  (36.6 C) 98.3 F (36.8 C) 98.1 F (36.7 C)  TempSrc: Axillary   Oral  SpO2: 96% 95% 96% 97%  Weight:      Height:        Intake/Output Summary (Last 24 hours) at 09/16/2017 1521 Last data filed at 09/16/2017 1436 Gross per 24 hour  Intake 290 ml  Output 176 ml  Net 114 ml   Filed Weights   09/14/17 2035 09/14/17 2224 09/15/17 0849  Weight: 81.6 kg (180 lb) 85.8 kg (189 lb 2.5 oz) 85.7 kg (189 lb)    Examination:  General exam: Appears calm and comfortable  Respiratory system: Clear to auscultation. Respiratory effort normal. Cardiovascular system: S1 & S2 heard, RRR.  Gastrointestinal system: Abdomen is nondistended, soft and nontender.  Central nervous system: Alert and oriented. No focal neurological deficits. Extremities: Warm, no deformities Skin: No rashes, lesions or ulcers, on limited exam  Psychiatry: Mood & affect appropriate.     Data Reviewed: I have personally reviewed following labs and imaging studies  CBC: Recent Labs  Lab 09/14/17 1255 09/15/17 0223  WBC 5.4 5.3  NEUTROABS 3.1  --   HGB 12.2* 10.6*  HCT 35.6* 30.3*  MCV 85.0 83.7  PLT 155 497*   Basic Metabolic Panel: Recent Labs  Lab 09/14/17 1255 09/15/17 0223 09/16/17 0343  NA 136 135 134*  K 3.8 3.8 3.9  CL 99* 102 103  CO2 23 22 20*  GLUCOSE 209* 194* 181*  BUN 10 5* 13  CREATININE 0.37*  0.73 1.88*  CALCIUM 9.6 8.9 9.0   GFR: Estimated Creatinine Clearance: 43 mL/min (A) (by C-G formula based on SCr of 1.88 mg/dL (H)). Liver Function Tests: Recent Labs  Lab 09/14/17 1255 09/14/17 2022 09/15/17 0223 09/16/17 0343  AST 95*  --  79* 90*  ALT 59  --  52 52  ALKPHOS 410*  --  347* 326*  BILITOT 23.4* 23.0* 21.5* 22.5*  PROT 8.5*  --  7.3 7.1  ALBUMIN 2.5*  --  2.1* 2.1*   Recent Labs  Lab 09/16/17 0343  LIPASE 45   Recent Labs  Lab 09/14/17 1256  AMMONIA 71*   Coagulation Profile: Recent Labs  Lab 09/14/17 1255  INR 1.35   Cardiac Enzymes: No results for  input(s): CKTOTAL, CKMB, CKMBINDEX, TROPONINI in the last 168 hours. BNP (last 3 results) No results for input(s): PROBNP in the last 8760 hours. HbA1C: No results for input(s): HGBA1C in the last 72 hours. CBG: Recent Labs  Lab 09/15/17 1212 09/15/17 1609 09/15/17 2049 09/16/17 0816 09/16/17 1217  GLUCAP 197* 229* 220* 173* 230*   Lipid Profile: No results for input(s): CHOL, HDL, LDLCALC, TRIG, CHOLHDL, LDLDIRECT in the last 72 hours. Thyroid Function Tests: No results for input(s): TSH, T4TOTAL, FREET4, T3FREE, THYROIDAB in the last 72 hours. Anemia Panel: No results for input(s): VITAMINB12, FOLATE, FERRITIN, TIBC, IRON, RETICCTPCT in the last 72 hours. Sepsis Labs: Recent Labs  Lab 09/14/17 1321 09/14/17 2038 09/15/17 0223  LATICACIDVEN 2.89* 2.63* 1.3    Recent Results (from the past 240 hour(s))  Culture, blood (Routine x 2)     Status: None (Preliminary result)   Collection Time: 09/14/17 12:55 PM  Result Value Ref Range Status   Specimen Description   Final    BLOOD RIGHT HAND BOTTLES DRAWN AEROBIC AND ANAEROBIC   Special Requests Blood Culture adequate volume  Final   Culture NO GROWTH 2 DAYS  Final   Report Status PENDING  Incomplete  Culture, blood (Routine x 2)     Status: None (Preliminary result)   Collection Time: 09/14/17 12:55 PM  Result Value Ref Range Status   Specimen Description   Final    LEFT ANTECUBITAL BOTTLES DRAWN AEROBIC AND ANAEROBIC   Special Requests   Final    Blood Culture results may not be optimal due to an excessive volume of blood received in culture bottles   Culture NO GROWTH 2 DAYS  Final   Report Status PENDING  Incomplete     Radiology Studies: Ct Abdomen Pelvis W Contrast  Result Date: 09/14/2017 CLINICAL DATA:  Hematuria for 2 weeks. EXAM: CT ABDOMEN AND PELVIS WITH CONTRAST TECHNIQUE: Multidetector CT imaging of the abdomen and pelvis was performed using the standard protocol following bolus administration of  intravenous contrast. CONTRAST:  170mL ISOVUE-300 IOPAMIDOL (ISOVUE-300) INJECTION 61% COMPARISON:  None. FINDINGS: Lower chest: Multiple nodules are identified in bilateral lung bases largest in the lateral left lung base measuring 7 mm. Scarring of right lung base is identified. The heart size is normal. Hepatobiliary: Multiple heterogeneously enhancing masses are identified within the liver. There is intrahepatic biliary ductal dilatation. No gallstone is identified. Pancreas: There multiple abnormal enlarged firm enhancing lymph nodes along the superior mesenteric and celiac axis. These abnormal lymph nodes are inseparable from the posterior aspect of the head the pancreas particularly on image 38 series 2. Spleen: Normal in size without focal abnormality. Adrenals/Urinary Tract: The adrenal glands are normal. There are small simple cysts in both kidneys.  There is no hydronephrosis bilaterally. The bladder is partially decompressed without gross abnormality. Stomach/Bowel: There is a thick walled stomach particularly in the mid to distal portion extending into the duodenum. There is no small bowel obstruction or diverticulitis. Vascular/Lymphatic: Enlarged abnormal lymph nodes are identified along the celiac and superior mesenteric axis. There also smaller lymph nodes in the right lower quadrant and along the periaortic region. Atherosclerosis of the abdominal aorta is identified without aneurysmal dilatation. Reproductive: Prostate enlargement is noted. Other: Small ascites is identified in the abdomen and pelvis. Musculoskeletal: Degenerative joint changes of the spine are identified. IMPRESSION: Findings consistent with metastasis in the liver and in the lungs. Abnormal metastatic lymph nodes are also identified in the abdomen. There is thick wall stomach particularly in the mid to distal portion extending into the duodenum, neoplastic involvement is not excluded. There is also inseparable abnormal lymph  nodes from the posterior aspect of had the pancreas, neoplastic involvement is not excluded. Electronically Signed   By: Abelardo Diesel M.D.   On: 09/14/2017 15:35   Dg Ercp Biliary & Pancreatic Ducts  Result Date: 09/15/2017 CLINICAL DATA:  Gallstones EXAM: ERCP TECHNIQUE: Multiple spot images obtained with the fluoroscopic device and submitted for interpretation post-procedure. FLUOROSCOPY TIME:  Fluoroscopy Time:  7 minutes and 25 seconds Radiation Exposure Index (if provided by the fluoroscopic device): Number of Acquired Spot Images: 0 COMPARISON:  None. FINDINGS: Images demonstrate cannulation of the common bile duct and contrast filling the biliary tree. A biliary stent has been placed on the final image. Balloon stone retrieval is noted. There is narrowing of the upper common bile duct of unknown significance. IMPRESSION: See above. These images were submitted for radiologic interpretation only. Please see the procedural report for the amount of contrast and the fluoroscopy time utilized. Electronically Signed   By: Marybelle Killings M.D.   On: 09/15/2017 12:30    Scheduled Meds: . feeding supplement  1 Container Oral TID BM  . feeding supplement (GLUCERNA SHAKE)  237 mL Oral BID BM  . Influenza vac split quadrivalent PF  0.5 mL Intramuscular Tomorrow-1000  . insulin aspart  0-9 Units Subcutaneous TID WC  . pneumococcal 23 valent vaccine  0.5 mL Intramuscular Tomorrow-1000  . polyethylene glycol  17 g Oral Daily  . QUEtiapine  400 mg Oral QHS   Continuous Infusions: . cefTRIAXone (ROCEPHIN)  IV Stopped (09/16/17 0022)  . lactated ringers 125 mL/hr at 09/16/17 1038     LOS: 2 days    Time spent: > 35 minutes  Velvet Bathe, MD [facility] Pager [phone]  If 7PM-7AM, please contact night-coverage [email or web address] Password [email or web address] 09/16/2017, 3:21 PM

## 2017-09-16 NOTE — Progress Notes (Signed)
Daily Rounding Note  09/16/2017, 9:43 AM  LOS: 2 days   SUBJECTIVE:   Chief complaint: abdominal pain starting last night, diffuse but > in upper abdomen.    Anorexia but no nausea or vomiting.  Still feels malaise, weak.  uriine output not as much as normal.  No IVF running.  No pruritus. Not aware of rapid heart rate.  No SOB or cough   OBJECTIVE:         Vital signs in last 24 hours:    Temp:  [97.2 F (36.2 C)-98.3 F (36.8 C)] 98.3 F (36.8 C) (12/30 0553) Pulse Rate:  [108-114] 112 (12/30 0553) Resp:  [13-18] 18 (12/30 0553) BP: (112-126)/(64-82) 113/67 (12/30 0553) SpO2:  [95 %-100 %] 96 % (12/30 0553) Last BM Date: 09/13/17 Filed Weights   09/14/17 2035 09/14/17 2224 09/15/17 0849  Weight: 81.6 kg (180 lb) 85.8 kg (189 lb 2.5 oz) 85.7 kg (189 lb)   General: jaundiced, icteric   Heart: tachy, regular.   Chest: crackles in lung bases.  No cough or dyspnea Abdomen: soft, ND, mild diffuse tenderness without guard or rebound  Extremities: no CCE Neuro/Psych:  Oriented x 3.  Calm, reserved.  Cooperative and pleasant.  No asymmetric weakness or deficits  Intake/Output from previous day: 12/29 0701 - 12/30 0700 In: 1210 [P.O.:460; I.V.:700; IV Piggyback:50] Out: 1 [Blood:1]  Intake/Output this shift: No intake/output data recorded.  Lab Results: Recent Labs    09/14/17 1255 09/15/17 0223  WBC 5.4 5.3  HGB 12.2* 10.6*  HCT 35.6* 30.3*  PLT 155 141*   BMET Recent Labs    09/14/17 1255 09/15/17 0223 09/16/17 0343  NA 136 135 134*  K 3.8 3.8 3.9  CL 99* 102 103  CO2 23 22 20*  GLUCOSE 209* 194* 181*  BUN 10 5* 13  CREATININE 0.37* 0.73 1.88*  CALCIUM 9.6 8.9 9.0   LFT Recent Labs    09/14/17 1255 09/14/17 2022 09/15/17 0223 09/16/17 0343  PROT 8.5*  --  7.3 7.1  ALBUMIN 2.5*  --  2.1* 2.1*  AST 95*  --  79* 90*  ALT 59  --  52 52  ALKPHOS 410*  --  347* 326*  BILITOT 23.4* 23.0*  21.5* 22.5*  BILIDIR  --  15.2*  --   --   IBILI  --  7.8*  --   --    PT/INR Recent Labs    09/14/17 1255  LABPROT 16.6*  INR 1.35   Hepatitis Panel No results for input(s): HEPBSAG, HCVAB, HEPAIGM, HEPBIGM in the last 72 hours.  Studies/Results: Ct Abdomen Pelvis W Contrast  Result Date: 09/14/2017 CLINICAL DATA:  Hematuria for 2 weeks. EXAM: CT ABDOMEN AND PELVIS WITH CONTRAST TECHNIQUE: Multidetector CT imaging of the abdomen and pelvis was performed using the standard protocol following bolus administration of intravenous contrast. CONTRAST:  147mL ISOVUE-300 IOPAMIDOL (ISOVUE-300) INJECTION 61% COMPARISON:  None. FINDINGS: Lower chest: Multiple nodules are identified in bilateral lung bases largest in the lateral left lung base measuring 7 mm. Scarring of right lung base is identified. The heart size is normal. Hepatobiliary: Multiple heterogeneously enhancing masses are identified within the liver. There is intrahepatic biliary ductal dilatation. No gallstone is identified. Pancreas: There multiple abnormal enlarged firm enhancing lymph nodes along the superior mesenteric and celiac axis. These abnormal lymph nodes are inseparable from the posterior aspect of the head the pancreas particularly on image 38 series 2. Spleen:  Normal in size without focal abnormality. Adrenals/Urinary Tract: The adrenal glands are normal. There are small simple cysts in both kidneys. There is no hydronephrosis bilaterally. The bladder is partially decompressed without gross abnormality. Stomach/Bowel: There is a thick walled stomach particularly in the mid to distal portion extending into the duodenum. There is no small bowel obstruction or diverticulitis. Vascular/Lymphatic: Enlarged abnormal lymph nodes are identified along the celiac and superior mesenteric axis. There also smaller lymph nodes in the right lower quadrant and along the periaortic region. Atherosclerosis of the abdominal aorta is identified  without aneurysmal dilatation. Reproductive: Prostate enlargement is noted. Other: Small ascites is identified in the abdomen and pelvis. Musculoskeletal: Degenerative joint changes of the spine are identified. IMPRESSION: Findings consistent with metastasis in the liver and in the lungs. Abnormal metastatic lymph nodes are also identified in the abdomen. There is thick wall stomach particularly in the mid to distal portion extending into the duodenum, neoplastic involvement is not excluded. There is also inseparable abnormal lymph nodes from the posterior aspect of had the pancreas, neoplastic involvement is not excluded. Electronically Signed   By: Abelardo Diesel M.D.   On: 09/14/2017 15:35   Dg Ercp Biliary & Pancreatic Ducts  Result Date: 09/15/2017 CLINICAL DATA:  Gallstones EXAM: ERCP TECHNIQUE: Multiple spot images obtained with the fluoroscopic device and submitted for interpretation post-procedure. FLUOROSCOPY TIME:  Fluoroscopy Time:  7 minutes and 25 seconds Radiation Exposure Index (if provided by the fluoroscopic device): Number of Acquired Spot Images: 0 COMPARISON:  None. FINDINGS: Images demonstrate cannulation of the common bile duct and contrast filling the biliary tree. A biliary stent has been placed on the final image. Balloon stone retrieval is noted. There is narrowing of the upper common bile duct of unknown significance. IMPRESSION: See above. These images were submitted for radiologic interpretation only. Please see the procedural report for the amount of contrast and the fluoroscopy time utilized. Electronically Signed   By: Marybelle Killings M.D.   On: 09/15/2017 12:30    ASSESMENT:   *  Obstructive jaundice.  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   12/29 ERCP: severe proximal biliary stricture, proximal choledocholithiasis.  Sphincterotomy performed but unable to remove stone due to stricture so plastic stent placed to CBD.  Brush cytology of stricture  obtained, cytopath pending.   LFTs not much improved.   CA 19-9 pending.    New,  Non-severe abdominal pain: r/o post ERCP pancreatitis.    *  Normocytic anemia.    *   Non-critical thrombocytopenia.      *  DM.    *  AKI.   suspect dehydrated.  Some oliguria.    *  Mild hyponatremia.                        PLAN   *  Await cytopath report, CA 19-9  *  Adding Lipase to previously drawn blood.  CMET in AM.    *  Starting LR at 125/hour.      [name] [name]  09/16/2017, 9:43 AM Pager: [phone]  I have discussed the case with the PA, and that is the plan I formulated. I personally interviewed and examined the patient.  Mr. Marzetta Board is about the same as before, he still has generalized malaise, poor appetite, and remains deeply jaundiced and icteric. His bilirubin has not improved after the ERCP and stent placement yesterday. Although there was a stone proximal to the  stricture, I believe the stent I placed does have its proximal extent above the stone. I was not able to pass a longer stent due to the stricture. It is possible that the stone in the duct is obstructing, but it should be noted that he has stents for hepatic metastasis, which I suspect is also causing a degree of intrahepatic cholestasis.  He was briefly seen by oncology earlier today, who are making plans for outpatient follow-up in the near future.  His cytology might not be diagnostic, but I suspect his CA-19-9 will be markedly elevated. If the cytology is not diagnostic, I do not know what oncologs feelings will be about the need for tissue diagnosis. If they feel strongly about it, then I suspect his liver lesions are amenable to percutaneous biopsy by interventional radiology. If his bilirubin does not start to go down soon him he may need consideration of a percutaneous drain. I am hopeful that will not be necessary, especially considering his probable short life expectancy.  I have ordered him some protein calorie  supplements in hopes they will provide him some nutrition since he is hardly eating anything. My partner Dr. Hilarie Fredrickson will check on him tomorrow.  [name] Pager [phone]  Mon-Fri 8a-5p [phone] after 5p, weekends, holidays

## 2017-09-17 ENCOUNTER — Inpatient Hospital Stay (HOSPITAL_COMMUNITY): Payer: Medicare Other

## 2017-09-17 DIAGNOSIS — K831 Obstruction of bile duct: Secondary | ICD-10-CM

## 2017-09-17 DIAGNOSIS — N179 Acute kidney failure, unspecified: Secondary | ICD-10-CM

## 2017-09-17 DIAGNOSIS — K8050 Calculus of bile duct without cholangitis or cholecystitis without obstruction: Secondary | ICD-10-CM

## 2017-09-17 DIAGNOSIS — D649 Anemia, unspecified: Secondary | ICD-10-CM

## 2017-09-17 DIAGNOSIS — K8689 Other specified diseases of pancreas: Secondary | ICD-10-CM

## 2017-09-17 DIAGNOSIS — I10 Essential (primary) hypertension: Secondary | ICD-10-CM

## 2017-09-17 DIAGNOSIS — R5381 Other malaise: Secondary | ICD-10-CM

## 2017-09-17 DIAGNOSIS — R933 Abnormal findings on diagnostic imaging of other parts of digestive tract: Secondary | ICD-10-CM

## 2017-09-17 DIAGNOSIS — K7200 Acute and subacute hepatic failure without coma: Secondary | ICD-10-CM

## 2017-09-17 LAB — COMPREHENSIVE METABOLIC PANEL
ALBUMIN: 2.1 g/dL — AB (ref 3.5–5.0)
ALK PHOS: 312 U/L — AB (ref 38–126)
ALT: 55 U/L (ref 17–63)
ANION GAP: 10 (ref 5–15)
AST: 110 U/L — ABNORMAL HIGH (ref 15–41)
BUN: 19 mg/dL (ref 6–20)
CHLORIDE: 98 mmol/L — AB (ref 101–111)
CO2: 23 mmol/L (ref 22–32)
CREATININE: 2.47 mg/dL — AB (ref 0.61–1.24)
Calcium: 8.7 mg/dL — ABNORMAL LOW (ref 8.9–10.3)
GFR calc non Af Amer: 26 mL/min — ABNORMAL LOW (ref >60)
GFR, EST AFRICAN AMERICAN: 30 mL/min — AB (ref >60)
GLUCOSE: 168 mg/dL — AB (ref 65–99)
Potassium: 3.8 mmol/L (ref 3.5–5.1)
SODIUM: 131 mmol/L — AB (ref 135–145)
Total Bilirubin: 21.3 mg/dL (ref 0.3–1.2)
Total Protein: 6.9 g/dL (ref 6.5–8.1)

## 2017-09-17 LAB — GLUCOSE, CAPILLARY
GLUCOSE-CAPILLARY: 275 mg/dL — AB (ref 65–99)
Glucose-Capillary: 199 mg/dL — ABNORMAL HIGH (ref 65–99)
Glucose-Capillary: 147 mg/dL — ABNORMAL HIGH (ref 65–99)
Glucose-Capillary: 172 mg/dL — ABNORMAL HIGH (ref 65–99)
Glucose-Capillary: 143 mg/dL — ABNORMAL HIGH (ref 65–99)

## 2017-09-17 LAB — CANCER ANTIGEN 19-9: CAN 19-9: 2985 U/mL — AB (ref 0–35)

## 2017-09-17 MED ORDER — SENNA 8.6 MG PO TABS
1.0000 | ORAL_TABLET | Freq: Every day | ORAL | Status: AC
  Administered 2017-09-17: 8.6 mg via ORAL
  Filled 2017-09-17: qty 1

## 2017-09-17 MED ORDER — SODIUM CHLORIDE 0.9 % IV SOLN
INTRAVENOUS | Status: DC
  Administered 2017-09-17 – 2017-09-18 (×2): 1000 mL via INTRAVENOUS
  Administered 2017-09-18 – 2017-09-21 (×6): via INTRAVENOUS
  Administered 2017-09-22: 100 mL/h via INTRAVENOUS
  Administered 2017-09-22 – 2017-09-23 (×2): via INTRAVENOUS

## 2017-09-17 NOTE — Progress Notes (Signed)
Patient ID: [name] [name], male   DOB: [birth year], 66 y.o.   MRN: [id number]  Request received for LAN biopsy per Dr Wendee Beavers ERCP just performed 12/29  Dr Annamaria Boots recommends waiting for bx result  If no tissue diagnosis---will consider percutaneous needle sample in IR

## 2017-09-17 NOTE — Evaluation (Signed)
Physical Therapy Evaluation Patient Details Name: [name] [name] MRN: [id number] DOB: 1951/05/17 Today's Date: 09/17/2017   History of Present Illness  66 y.o. male with medical history significant for  DM2, HTN, BPD, who presented to the ED with complaints of dizziness, abdominal pains for the past 2 months. Pt was found to have a bilirubin of 23. CT scan of abdomen revealed a metastatic pancreatic tumor with a large burden of peripancreatic adenopathy and large liver masses.  Clinical Impression  Orders received for PT evaluation. Patient demonstrates deficits in functional mobility as indicated below. Will benefit from continued skilled PT to address deficits and maximize function. Will see as indicated and progress as tolerated.      Follow Up Recommendations Home health PT;Supervision - Intermittent    Equipment Recommendations  (TBD)    Recommendations for Other Services       Precautions / Restrictions Precautions Precautions: Fall Restrictions Weight Bearing Restrictions: No      Mobility  Bed Mobility Overal bed mobility: Modified Independent             General bed mobility comments: increased time and use of rail to come to EOB  Transfers Overall transfer level: Needs assistance Equipment used: None Transfers: Sit to/from Stand Sit to Stand: Min guard         General transfer comment: min guard for safety, posterior bias with calves resting on side of bed for stability  Ambulation/Gait Ambulation/Gait assistance: Min guard Ambulation Distance (Feet): 180 Feet Assistive device: (pushive IV pole) Gait Pattern/deviations: Decreased stride length;Drifts right/left;Narrow base of support Gait velocity: decreased Gait velocity interpretation: Below normal speed for age/gender General Gait Details: modest instability noted, decreased gait speed, one standing rest break  Stairs            Wheelchair Mobility    Modified Rankin (Stroke Patients  Only)       Balance Overall balance assessment: Needs assistance   Sitting balance-Leahy Scale: Good     Standing balance support: Single extremity supported Standing balance-Leahy Scale: Fair                               Pertinent Vitals/Pain Pain Assessment: Faces Faces Pain Scale: Hurts even more Pain Location: abdominal pain Pain Descriptors / Indicators: Guarding;Grimacing Pain Intervention(s): Monitored during session    Home Living Family/patient expects to be discharged to:: Private residence Living Arrangements: Alone Available Help at Discharge: Available PRN/intermittently Type of Home: House Home Access: Stairs to enter Entrance Stairs-Rails: Can reach both Entrance Stairs-Number of Steps: 6 Home Layout: Multi-level Home Equipment: Cane - single point(2 canes)      Prior Function Level of Independence: Independent               Hand Dominance   Dominant Hand: Right    Extremity/Trunk Assessment   Upper Extremity Assessment Upper Extremity Assessment: Overall WFL for tasks assessed    Lower Extremity Assessment Lower Extremity Assessment: Generalized weakness       Communication   Communication: No difficulties  Cognition Arousal/Alertness: Awake/alert Behavior During Therapy: WFL for tasks assessed/performed Overall Cognitive Status: No family/caregiver present to determine baseline cognitive functioning                                        General Comments      Exercises  Assessment/Plan    PT Assessment Patient needs continued PT services  PT Problem List Decreased strength;Decreased activity tolerance;Decreased balance;Decreased mobility;Decreased safety awareness;Pain       PT Treatment Interventions DME instruction;Gait training;Stair training;Functional mobility training;Therapeutic activities;Therapeutic exercise;Balance training;Patient/family education    PT Goals (Current goals can  be found in the Care Plan section)  Acute Rehab PT Goals Patient Stated Goal: to find out what is going on PT Goal Formulation: With patient Time For Goal Achievement: 10/01/17 Potential to Achieve Goals: Good    Frequency Min 3X/week   Barriers to discharge        Co-evaluation               AM-PAC PT "6 Clicks" Daily Activity  Outcome Measure Difficulty turning over in bed (including adjusting bedclothes, sheets and blankets)?: None Difficulty moving from lying on back to sitting on the side of the bed? : A Little Difficulty sitting down on and standing up from a chair with arms (e.g., wheelchair, bedside commode, etc,.)?: A Little Help needed moving to and from a bed to chair (including a wheelchair)?: A Little Help needed walking in hospital room?: A Little Help needed climbing 3-5 steps with a railing? : A Little 6 Click Score: 19    End of Session Equipment Utilized During Treatment: Gait belt Activity Tolerance: Patient tolerated treatment well Patient left: in chair;with call bell/phone within reach;with chair alarm set Nurse Communication: Mobility status PT Visit Diagnosis: Unsteadiness on feet (R26.81)    Time: 3491-7915 PT Time Calculation (min) (ACUTE ONLY): 21 min   Charges:   PT Evaluation $PT Eval Moderate Complexity: 1 Mod     PT G Codes:        Alben Deeds, PT DPT  Board Certified Neurologic Specialist [place] 09/17/2017, 1:38 PM

## 2017-09-17 NOTE — Progress Notes (Signed)
Per MD order bladder scan was done - 153 ml urine found. MD informed. Will continue to monitor.

## 2017-09-17 NOTE — Progress Notes (Signed)
Initial Nutrition Assessment  DOCUMENTATION CODES:   Not applicable  INTERVENTION:  Continue Glucerna Shake po BID, each supplement provides 220 kcal and 10 grams of protein.  Continue Boost Breeze po TID, each supplement provides 250 kcal and 9 grams of protein.  Encourage adequate PO intake.   NUTRITION DIAGNOSIS:   Increased nutrient needs related to chronic illness as evidenced by estimated needs.  GOAL:   Patient will meet greater than or equal to 90% of their needs  MONITOR:   PO intake, Supplement acceptance, Labs, Weight trends, I & O's, Skin  REASON FOR ASSESSMENT:   Malnutrition Screening Tool    ASSESSMENT:   66 y.o. male with medical history significant for  DM2, HTN, BPD, who presented to the ED with complaints of dizziness, abdominal pains for the past 2 months. Pt was found to have a bilirubin of 23. CT scan of abdomen revealed a metastatic pancreatic tumor with a large burden of peripancreatic adenopathy and large liver masses.  Pt s/p ERCP with of stone, stent placement in the common bile duct. Pt reports he has been consuming soft foods over the past 2 weeks from recent dental removal. Pt reports consuming applesauce, soups, mashed potatoes and at least Ensure 3 times daily. Meal completion 10% at lunch today. Pt reports weight loss with usual body weight of ~200 lbs. Pt with a 5.5% weight loss over the past 2 weeks. Pt currently has Boost Breeze and Glucerna ordered and has been consuming them. RD to continue with current orders to aid in adequate nutrition.    Labs and medications reviewed.   NUTRITION - FOCUSED PHYSICAL EXAM:    Most Recent Value  Orbital Region  Unable to assess  Upper Arm Region  No depletion  Thoracic and Lumbar Region  No depletion  Buccal Region  No depletion  Temple Region  No depletion  Clavicle Bone Region  No depletion  Clavicle and Acromion Bone Region  No depletion  Scapular Bone Region  No depletion  Dorsal Hand  No  depletion  Patellar Region  No depletion  Anterior Thigh Region  No depletion  Posterior Calf Region  No depletion  Edema (RD Assessment)  None  Hair  Reviewed  Eyes  Reviewed  Mouth  Reviewed  Skin  Reviewed  Nails  Reviewed       Diet Order:  Diet heart healthy/carb modified Room service appropriate? Yes; Fluid consistency: Thin  EDUCATION NEEDS:   Not appropriate for education at this time  Skin:  Skin Assessment: Reviewed RN Assessment  Last BM:  12/27  Height:   Ht Readings from Last 1 Encounters:  09/15/17 6' (1.829 m)    Weight:   Wt Readings from Last 1 Encounters:  09/15/17 189 lb (85.7 kg)    Ideal Body Weight:  81 kg  BMI:  Body mass index is 25.63 kg/m.  Estimated Nutritional Needs:   Kcal:  2150-2350  Protein:  105-115 grams  Fluid:  2.1 - 2.3 L/day    Corrin Parker, MS, RD, LDN Pager # [phone] After hours/ weekend pager # [phone]

## 2017-09-17 NOTE — Progress Notes (Signed)
Daily Rounding Note  09/17/2017, 10:17 AM  LOS: 3 days   SUBJECTIVE:     Discomfort in lower abdomen.  No BM in  3 to 4 days, norm is every 2 days.   Eating poorly.  No N/V Dr Wendee Beavers ordered liver mass biopsy Denies SOB but coughs up yellow sputum.   Says he has not been out of bed since arrival.    OBJECTIVE:         Vital signs in last 24 hours:    Temp:  [97.9 F (36.6 C)-98.3 F (36.8 C)] 98.3 F (36.8 C) (12/31 0515) Pulse Rate:  [112-117] 113 (12/31 0515) Resp:  [18] 18 (12/31 0515) BP: (101-121)/(66-79) 121/79 (12/31 0515) SpO2:  [97 %-99 %] 99 % (12/31 0515) Last BM Date: 09/13/17 Filed Weights   09/14/17 2035 09/14/17 2224 09/15/17 0849  Weight: 81.6 kg (180 lb) 85.8 kg (189 lb 2.5 oz) 85.7 kg (189 lb)   General: weak, comfortable   Heart: RRR Chest: diminished BS on left base but all clear bil.  No SOB no cough Abdomen: soft, ND, minor lower abd tenderness  Extremities: no CCE Neuro/Psych:  Oriented x 3.  Generally weak but no actual limb weakness.  Difficulty  Sitting up in bed from supine position.  No tremor.    Intake/Output from previous day: 12/30 0701 - 12/31 0700 In: 1364.6 [I.V.:1314.6; IV Piggyback:50] Out: 176 [Urine:176]  Intake/Output this shift: Total I/O In: 230 [P.O.:230] Out: -   Lab Results: Recent Labs    09/14/17 1255 09/15/17 0223  WBC 5.4 5.3  HGB 12.2* 10.6*  HCT 35.6* 30.3*  PLT 155 141*   BMET Recent Labs    09/15/17 0223 09/16/17 0343 09/17/17 0223  NA 135 134* 131*  K 3.8 3.9 3.8  CL 102 103 98*  CO2 22 20* 23  GLUCOSE 194* 181* 168*  BUN 5* 13 19  CREATININE 0.73 1.88* 2.47*  CALCIUM 8.9 9.0 8.7*   LFT Recent Labs    09/14/17 2022 09/15/17 0223 09/16/17 0343 09/17/17 0223  PROT  --  7.3 7.1 6.9  ALBUMIN  --  2.1* 2.1* 2.1*  AST  --  79* 90* 110*  ALT  --  52 52 55  ALKPHOS  --  347* 326* 312*  BILITOT 23.0* 21.5* 22.5* 21.3*  BILIDIR  15.2*  --   --   --   IBILI 7.8*  --   --   --    PT/INR Recent Labs    09/14/17 1255  LABPROT 16.6*  INR 1.35   Hepatitis Panel No results for input(s): HEPBSAG, HCVAB, HEPAIGM, HEPBIGM in the last 72 hours.  Studies/Results: Dg Ercp Biliary & Pancreatic Ducts  Result Date: 09/15/2017 CLINICAL DATA:  Gallstones EXAM: ERCP TECHNIQUE: Multiple spot images obtained with the fluoroscopic device and submitted for interpretation post-procedure. FLUOROSCOPY TIME:  Fluoroscopy Time:  7 minutes and 25 seconds Radiation Exposure Index (if provided by the fluoroscopic device): Number of Acquired Spot Images: 0 COMPARISON:  None. FINDINGS: Images demonstrate cannulation of the common bile duct and contrast filling the biliary tree. A biliary stent has been placed on the final image. Balloon stone retrieval is noted. There is narrowing of the upper common bile duct of unknown significance. IMPRESSION: See above. These images were submitted for radiologic interpretation only. Please see the procedural report for the amount of contrast and the fluoroscopy time utilized. Electronically Signed   By: Arnell Sieving  Hoss M.D.   On: 09/15/2017 12:30    ASSESMENT:   *  Obstructive jaundice.  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   12/29 ERCP: severe proximal biliary stricture, proximal choledocholithiasis.  Sphincterotomy performed but unable to remove stone due to stricture so plastic stent placed to CBD.  Brush cytology of stricture obtained, cytopath pending.   T bili marginally improved.  Alk phos slightly improved.  AST/ALT rising. Post ERCP Lipase normal. ? If liver tumor burden leading to intrahepatic cholestasis in addition to obstructive jaundice. CA 19-9 pending.     *  Normocytic anemia.    *   Hyponatremia.  Non-critical.    *   Non-critical thrombocytopenia.    *  DM.    *  AKI. Worsening despite addition of LR at 125/hour 12/30.    Some oliguria.    *  Mild  hyponatremia.    *  Deconditioning.      PLAN   *  Await bile duct brushing cytology report and CA 19-9.   *  Full oncology consult with Dr Learta Codding of Dr Burr Medico planned for outpt.   *  CBC and CMET in AM.    *  I ordered PT consult  *  senokot tonight.      Azucena Freed  09/17/2017, 10:17 AM Pager: [phone]

## 2017-09-17 NOTE — Progress Notes (Signed)
PROGRESS NOTE  [name] [name] WNI:[id number] DOB: 11-Mar-1951 DOA: 09/14/2017 PCP: Lucia Gaskins, MD  HPI/Recap of past 24 hours: 66 y.o.malewith medical history significant for DM2, HTN, BPD, who presented to the ED with complaints of dizziness, such that hewas was unable to get up from his bed.Also noted abdominal pain of about 2 months duration worsened over the past 2 days with noted jaundice. Pt was found to have a bilirubin of 23. CT scan of abdomen revealed a metastatic pancreatic tumor with a large burden of peripancreatic adenopathy and large liver masses. GI subsequently consulted who performed ERCP which included stent placement in the common bile duct. Oncology on board, to follow up as an outpt. Pt admitted for further management  Today, pt still c/o abdominal pain, no bowel movement in a couple of days, aware of whats going on with him medically.  Poor appetite. denies any chest pain, SOB, N/V/D/C, fever/chills.  Assessment/Plan: Principal Problem:   Abdominal pain Active Problems:   DM2 (diabetes mellitus, type 2) ([place])   BIPOLAR DISORDER UNSPECIFIED   Essential hypertension   Obstructive jaundice  #Abdominal pain/Obstructive jaundice Likely due to metastatic pancreatic CA Transaminitis, T.bili markedly elevated, AST/ALT/ALP rising, CA 19-9 elevated CT abdomen: metastatic pancreatic tumor with a large burden of peripancreatic adenopathy and large liver masses ERCP performed on 12/29: severeproximalbiliary stricture, proximal choledocholithiasis. Sphincterotomy performed but unable to remove stone due to stricture so plastic stent placed to CBD Brush cytology, non-diagnostic for malignancy Percutaneous liver biopsy pending by IR, order placed GI on board, appreciate further recs Oncology consulted, will follow pt as an outpt once diagnosis is confirmed  #AKI  Cr normal at baseline, currently 2.47 On LR, will switch to NS at 125cc/hr Bladder scan/strict I &  O, avoid nephrotoxics USS renal pending Daily BMP If persistent, will consider nephrology  #??UTI - started on IV Rocephin - urine cultures pending  #DM2 (diabetes mellitus, type 2) - SSI - Carb modified diet  #Bipolar - seroquel  #Essential hypertension - stable off antihypertensives     Code Status: Full  Family Communication: None at bedside  Disposition Plan: Home once work-up complete   Consultants:  GI  Oncology  Procedures:  ERCP on 12/29  Antimicrobials:  IV Ceftriaxone  DVT prophylaxis:  SCDs for now due to upcoming biopsy   Objective: Vitals:   09/16/17 1438 09/16/17 2223 09/17/17 0515 09/17/17 1311  BP: 101/66 102/69 121/79 139/79  Pulse: (!) 117 (!) 112 (!) 113 (!) 116  Resp:  18 18 18   Temp: 98.1 F (36.7 C) 97.9 F (36.6 C) 98.3 F (36.8 C) 97.8 F (36.6 C)  TempSrc: Oral Oral Oral Oral  SpO2: 97% 99% 99% 98%  Weight:      Height:        Intake/Output Summary (Last 24 hours) at 09/17/2017 1701 Last data filed at 09/17/2017 1612 Gross per 24 hour  Intake 2581.67 ml  Output 200 ml  Net 2381.67 ml   Filed Weights   09/14/17 2035 09/14/17 2224 09/15/17 0849  Weight: 81.6 kg (180 lb) 85.8 kg (189 lb 2.5 oz) 85.7 kg (189 lb)    Exam:   General: Alert, awake, oriented, not in acute distress, weak  Cardiovascular: S1-S2 present, no added heart sounds  Respiratory: Diminished breath sounds at the bases, no wheezing noted  Abdomen: Soft, mild tenderness on lower abdomen, nondistended, bowel sounds present  Musculoskeletal: No pedal edema bilaterally  Skin: Jaundiced  Psychiatry: Normal   Data Reviewed: CBC:  Recent Labs  Lab 09/14/17 1255 09/15/17 0223  WBC 5.4 5.3  NEUTROABS 3.1  --   HGB 12.2* 10.6*  HCT 35.6* 30.3*  MCV 85.0 83.7  PLT 155 465*   Basic Metabolic Panel: Recent Labs  Lab 09/14/17 1255 09/15/17 0223 09/16/17 0343 09/17/17 0223  NA 136 135 134* 131*  K 3.8 3.8 3.9 3.8  CL 99* 102  103 98*  CO2 23 22 20* 23  GLUCOSE 209* 194* 181* 168*  BUN 10 5* 13 19  CREATININE 0.37* 0.73 1.88* 2.47*  CALCIUM 9.6 8.9 9.0 8.7*   GFR: Estimated Creatinine Clearance: 32.7 mL/min (A) (by C-G formula based on SCr of 2.47 mg/dL (H)). Liver Function Tests: Recent Labs  Lab 09/14/17 1255 09/14/17 2022 09/15/17 0223 09/16/17 0343 09/17/17 0223  AST 95*  --  79* 90* 110*  ALT 59  --  52 52 55  ALKPHOS 410*  --  347* 326* 312*  BILITOT 23.4* 23.0* 21.5* 22.5* 21.3*  PROT 8.5*  --  7.3 7.1 6.9  ALBUMIN 2.5*  --  2.1* 2.1* 2.1*   Recent Labs  Lab 09/16/17 0343  LIPASE 45   Recent Labs  Lab 09/14/17 1256  AMMONIA 71*   Coagulation Profile: Recent Labs  Lab 09/14/17 1255  INR 1.35   Cardiac Enzymes: No results for input(s): CKTOTAL, CKMB, CKMBINDEX, TROPONINI in the last 168 hours. BNP (last 3 results) No results for input(s): PROBNP in the last 8760 hours. HbA1C: No results for input(s): HGBA1C in the last 72 hours. CBG: Recent Labs  Lab 09/16/17 1217 09/16/17 1644 09/16/17 2226 09/17/17 0738 09/17/17 1200  GLUCAP 230* 203* 198* 147* 172*   Lipid Profile: No results for input(s): CHOL, HDL, LDLCALC, TRIG, CHOLHDL, LDLDIRECT in the last 72 hours. Thyroid Function Tests: No results for input(s): TSH, T4TOTAL, FREET4, T3FREE, THYROIDAB in the last 72 hours. Anemia Panel: No results for input(s): VITAMINB12, FOLATE, FERRITIN, TIBC, IRON, RETICCTPCT in the last 72 hours. Urine analysis:    Component Value Date/Time   COLORURINE AMBER (A) 09/14/2017 1256   APPEARANCEUR CLOUDY (A) 09/14/2017 1256   LABSPEC 1.020 09/14/2017 1256   PHURINE 5.0 09/14/2017 1256   GLUCOSEU 50 (A) 09/14/2017 1256   HGBUR NEGATIVE 09/14/2017 1256   BILIRUBINUR MODERATE (A) 09/14/2017 1256   KETONESUR NEGATIVE 09/14/2017 1256   PROTEINUR 30 (A) 09/14/2017 1256   UROBILINOGEN 1.0 02/07/2013 1814   NITRITE NEGATIVE 09/14/2017 1256   LEUKOCYTESUR NEGATIVE 09/14/2017 1256    Sepsis Labs: @LABRCNTIP (procalcitonin:4,lacticidven:4)  ) Recent Results (from the past 240 hour(s))  Culture, blood (Routine x 2)     Status: None (Preliminary result)   Collection Time: 09/14/17 12:55 PM  Result Value Ref Range Status   Specimen Description   Final    BLOOD RIGHT HAND BOTTLES DRAWN AEROBIC AND ANAEROBIC   Special Requests Blood Culture adequate volume  Final   Culture NO GROWTH 3 DAYS  Final   Report Status PENDING  Incomplete  Culture, blood (Routine x 2)     Status: None (Preliminary result)   Collection Time: 09/14/17 12:55 PM  Result Value Ref Range Status   Specimen Description   Final    LEFT ANTECUBITAL BOTTLES DRAWN AEROBIC AND ANAEROBIC   Special Requests   Final    Blood Culture results may not be optimal due to an excessive volume of blood received in culture bottles   Culture NO GROWTH 3 DAYS  Final   Report Status PENDING  Incomplete  Studies: No results found.  Scheduled Meds: . feeding supplement  1 Container Oral TID BM  . feeding supplement (GLUCERNA SHAKE)  237 mL Oral BID BM  . Influenza vac split quadrivalent PF  0.5 mL Intramuscular Tomorrow-1000  . insulin aspart  0-9 Units Subcutaneous TID WC  . pneumococcal 23 valent vaccine  0.5 mL Intramuscular Tomorrow-1000  . polyethylene glycol  17 g Oral Daily  . QUEtiapine  400 mg Oral QHS  . senna  1 tablet Oral QHS    Continuous Infusions: . cefTRIAXone (ROCEPHIN)  IV Stopped (09/17/17 0025)  . lactated ringers 1,000 mL (09/17/17 1314)     LOS: 3 days     Alma Friendly, MD [facility]   If 7PM-7AM, please contact night-coverage [email or web address] Password [email or web address] 09/17/2017, 5:01 PM

## 2017-09-17 NOTE — Progress Notes (Signed)
Patient learned of masses on liver and lungs.  Nurse expressed he would likely appreciate chaplain visit. Had brief initial visit and prayer. Patient wanted to pray for peace. Had prayer with patient. Conard Novak, Chaplain   09/17/17 1400  Clinical Encounter Type  Visited With Patient  Visit Type Initial;Spiritual support  Referral From Nurse  Consult/Referral To Chaplain  Spiritual Encounters  Spiritual Needs Prayer;Emotional

## 2017-09-18 LAB — CBC
HEMATOCRIT: 27.7 % — AB (ref 39.0–52.0)
Hemoglobin: 9.9 g/dL — ABNORMAL LOW (ref 13.0–17.0)
MCH: 29.7 pg (ref 26.0–34.0)
MCHC: 35.7 g/dL (ref 30.0–36.0)
MCV: 83.2 fL (ref 78.0–100.0)
Platelets: 129 10*3/uL — ABNORMAL LOW (ref 150–400)
RBC: 3.33 MIL/uL — ABNORMAL LOW (ref 4.22–5.81)
RDW: 19.2 % — AB (ref 11.5–15.5)
WBC: 7.6 10*3/uL (ref 4.0–10.5)

## 2017-09-18 LAB — GLUCOSE, CAPILLARY
GLUCOSE-CAPILLARY: 150 mg/dL — AB (ref 65–99)
Glucose-Capillary: 159 mg/dL — ABNORMAL HIGH (ref 65–99)
Glucose-Capillary: 217 mg/dL — ABNORMAL HIGH (ref 65–99)
Glucose-Capillary: 180 mg/dL — ABNORMAL HIGH (ref 65–99)

## 2017-09-18 LAB — COMPREHENSIVE METABOLIC PANEL
ALT: 54 U/L (ref 17–63)
AST: 102 U/L — ABNORMAL HIGH (ref 15–41)
Albumin: 2 g/dL — ABNORMAL LOW (ref 3.5–5.0)
Alkaline Phosphatase: 304 U/L — ABNORMAL HIGH (ref 38–126)
Anion gap: 11 (ref 5–15)
BILIRUBIN TOTAL: 21 mg/dL — AB (ref 0.3–1.2)
BUN: 17 mg/dL (ref 6–20)
CALCIUM: 8.9 mg/dL (ref 8.9–10.3)
CO2: 19 mmol/L — ABNORMAL LOW (ref 22–32)
Chloride: 101 mmol/L (ref 101–111)
Creatinine, Ser: 1.95 mg/dL — ABNORMAL HIGH (ref 0.61–1.24)
GFR, EST AFRICAN AMERICAN: 40 mL/min — AB (ref >60)
GFR, EST NON AFRICAN AMERICAN: 34 mL/min — AB (ref >60)
Glucose, Bld: 149 mg/dL — ABNORMAL HIGH (ref 65–99)
POTASSIUM: 3.7 mmol/L (ref 3.5–5.1)
Sodium: 131 mmol/L — ABNORMAL LOW (ref 135–145)
TOTAL PROTEIN: 7 g/dL (ref 6.5–8.1)

## 2017-09-18 MED ORDER — SENNOSIDES-DOCUSATE SODIUM 8.6-50 MG PO TABS
1.0000 | ORAL_TABLET | Freq: Two times a day (BID) | ORAL | Status: DC
  Administered 2017-09-18 – 2017-09-22 (×8): 1 via ORAL
  Filled 2017-09-18 (×8): qty 1

## 2017-09-18 NOTE — Progress Notes (Signed)
PROGRESS NOTE  [name] [name] OEV:[id number] DOB: June 17, 1951 DOA: 09/14/2017 PCP: Lucia Gaskins, MD  HPI/Recap of past 24 hours: 67 y.o.malewith medical history significant for DM2, HTN, BPD, who presented to the ED with complaints of dizziness, such that hewas was unable to get up from his bed.Also noted abdominal pain of about 2 months duration worsened over the past 2 days with noted jaundice. Pt was found to have a bilirubin of 23. CT scan of abdomen revealed a metastatic pancreatic tumor with a large burden of peripancreatic adenopathy and large liver masses. GI subsequently consulted who performed ERCP which included stent placement in the common bile duct. Oncology on board, to follow up as an outpt. Pt admitted for further management  Today, pt still c/o abdominal pain, no bowel movement in a couple of days. Poor appetite. denies any chest pain, SOB, N/V/D, fever/chills.  Assessment/Plan: Principal Problem:   Abdominal pain Active Problems:   DM2 (diabetes mellitus, type 2) ([place])   BIPOLAR DISORDER UNSPECIFIED   Essential hypertension   Obstructive jaundice  #Abdominal pain/Obstructive jaundice Likely due to metastatic pancreatic CA Transaminitis, T.bili markedly elevated, AST/ALT/ALP rising, CA 19-9 elevated CT abdomen: metastatic pancreatic tumor with a large burden of peripancreatic adenopathy and large liver masses ERCP performed on 12/29: severeproximalbiliary stricture, proximal choledocholithiasis. Sphincterotomy performed but unable to remove stone due to stricture so plastic stent placed to CBD Brush cytology, non-diagnostic for malignancy Percutaneous liver biopsy pending by IR, order placed GI on board, appreciate further recs Oncology consulted, will follow pt as an outpt once diagnosis is confirmed  #AKI Improving  Likely ??contrast induced Cr normal at baseline Continue NS at 100cc/hr Bladder scan, no retension USS, normal kidney, no  hydronephrosis Strict I & O, avoid nephrotoxics Daily BMP If persistent, will consider nephrology  #??UTI - started on IV Rocephin - urine cultures pending  #DM2 (diabetes mellitus, type 2) - SSI - Carb modified diet  #Bipolar - seroquel  #Essential hypertension - stable off antihypertensives     Code Status: Full  Family Communication: None at bedside  Disposition Plan: Home once work-up complete   Consultants:  GI  Oncology  Procedures:  ERCP on 12/29  Antimicrobials:  IV Ceftriaxone  DVT prophylaxis:  SCDs for now due to upcoming biopsy   Objective: Vitals:   09/17/17 0515 09/17/17 1311 09/17/17 2222 09/18/17 0448  BP: 121/79 139/79 114/67 133/70  Pulse: (!) 113 (!) 116 (!) 107 (!) 113  Resp: 18 18 16 20   Temp: 98.3 F (36.8 C) 97.8 F (36.6 C) (!) 97.4 F (36.3 C) 98.4 F (36.9 C)  TempSrc: Oral Oral Oral Oral  SpO2: 99% 98% 98% 98%  Weight:      Height:        Intake/Output Summary (Last 24 hours) at 09/18/2017 1306 Last data filed at 09/18/2017 0946 Gross per 24 hour  Intake 2098.74 ml  Output 350 ml  Net 1748.74 ml   Filed Weights   09/14/17 2035 09/14/17 2224 09/15/17 0849  Weight: 81.6 kg (180 lb) 85.8 kg (189 lb 2.5 oz) 85.7 kg (189 lb)    Exam:   General: Alert, awake, oriented, not in acute distress, weak  Cardiovascular: S1-S2 present, no added heart sounds  Respiratory: Diminished breath sounds at the bases, no wheezing noted  Abdomen: Soft, mild tenderness on lower abdomen, nondistended, bowel sounds present  Musculoskeletal: No pedal edema bilaterally  Skin: Jaundiced  Psychiatry: Normal   Data Reviewed: CBC: Recent Labs  Lab 09/14/17  1255 09/15/17 0223 09/18/17 0416  WBC 5.4 5.3 7.6  NEUTROABS 3.1  --   --   HGB 12.2* 10.6* 9.9*  HCT 35.6* 30.3* 27.7*  MCV 85.0 83.7 83.2  PLT 155 141* 062*   Basic Metabolic Panel: Recent Labs  Lab 09/14/17 1255 09/15/17 0223 09/16/17 0343 09/17/17 0223  09/18/17 0416  NA 136 135 134* 131* 131*  K 3.8 3.8 3.9 3.8 3.7  CL 99* 102 103 98* 101  CO2 23 22 20* 23 19*  GLUCOSE 209* 194* 181* 168* 149*  BUN 10 5* 13 19 17   CREATININE 0.37* 0.73 1.88* 2.47* 1.95*  CALCIUM 9.6 8.9 9.0 8.7* 8.9   GFR: Estimated Creatinine Clearance: 41.5 mL/min (A) (by C-G formula based on SCr of 1.95 mg/dL (H)). Liver Function Tests: Recent Labs  Lab 09/14/17 1255 09/14/17 2022 09/15/17 0223 09/16/17 0343 09/17/17 0223 09/18/17 0416  AST 95*  --  79* 90* 110* 102*  ALT 59  --  52 52 55 54  ALKPHOS 410*  --  347* 326* 312* 304*  BILITOT 23.4* 23.0* 21.5* 22.5* 21.3* 21.0*  PROT 8.5*  --  7.3 7.1 6.9 7.0  ALBUMIN 2.5*  --  2.1* 2.1* 2.1* 2.0*   Recent Labs  Lab 09/16/17 0343  LIPASE 45   Recent Labs  Lab 09/14/17 1256  AMMONIA 71*   Coagulation Profile: Recent Labs  Lab 09/14/17 1255  INR 1.35   Cardiac Enzymes: No results for input(s): CKTOTAL, CKMB, CKMBINDEX, TROPONINI in the last 168 hours. BNP (last 3 results) No results for input(s): PROBNP in the last 8760 hours. HbA1C: No results for input(s): HGBA1C in the last 72 hours. CBG: Recent Labs  Lab 09/17/17 1200 09/17/17 1714 09/17/17 2218 09/18/17 0806 09/18/17 1219  GLUCAP 172* 275* 143* 150* 159*   Lipid Profile: No results for input(s): CHOL, HDL, LDLCALC, TRIG, CHOLHDL, LDLDIRECT in the last 72 hours. Thyroid Function Tests: No results for input(s): TSH, T4TOTAL, FREET4, T3FREE, THYROIDAB in the last 72 hours. Anemia Panel: No results for input(s): VITAMINB12, FOLATE, FERRITIN, TIBC, IRON, RETICCTPCT in the last 72 hours. Urine analysis:    Component Value Date/Time   COLORURINE AMBER (A) 09/14/2017 1256   APPEARANCEUR CLOUDY (A) 09/14/2017 1256   LABSPEC 1.020 09/14/2017 1256   PHURINE 5.0 09/14/2017 1256   GLUCOSEU 50 (A) 09/14/2017 1256   HGBUR NEGATIVE 09/14/2017 1256   BILIRUBINUR MODERATE (A) 09/14/2017 1256   KETONESUR NEGATIVE 09/14/2017 1256    PROTEINUR 30 (A) 09/14/2017 1256   UROBILINOGEN 1.0 02/07/2013 1814   NITRITE NEGATIVE 09/14/2017 1256   LEUKOCYTESUR NEGATIVE 09/14/2017 1256   Sepsis Labs: @LABRCNTIP (procalcitonin:4,lacticidven:4)  ) Recent Results (from the past 240 hour(s))  Culture, blood (Routine x 2)     Status: None (Preliminary result)   Collection Time: 09/14/17 12:55 PM  Result Value Ref Range Status   Specimen Description   Final    BLOOD RIGHT HAND BOTTLES DRAWN AEROBIC AND ANAEROBIC   Special Requests Blood Culture adequate volume  Final   Culture NO GROWTH 4 DAYS  Final   Report Status PENDING  Incomplete  Culture, blood (Routine x 2)     Status: None (Preliminary result)   Collection Time: 09/14/17 12:55 PM  Result Value Ref Range Status   Specimen Description   Final    LEFT ANTECUBITAL BOTTLES DRAWN AEROBIC AND ANAEROBIC   Special Requests   Final    Blood Culture results may not be optimal due to an excessive volume  of blood received in culture bottles   Culture NO GROWTH 4 DAYS  Final   Report Status PENDING  Incomplete      Studies: US Renal  Result Date: 09/17/2017 CLINICAL DATA:  Acute kidney injury. EXAM: RENAL / URINARY TRACT ULTRASOUND COMPLETE COMPARISON:  CT abdomen pelvis 09/14/2017 FINDINGS: Right Kidney: Length: 11.4 cm. Echogenicity within normal limits. No mass or hydronephrosis visualized. Left Kidney: Length: 11 cm. Echogenicity within normal limits. No mass or hydronephrosis visualized. Bladder: Appears normal for degree of bladder distention. Prostate gland is enlarged, measuring 4.7 x 3.8 x 5.1 cm. Prostate makes impression on the bladder base. Moderate free fluid in the abdomen consistent with ascites. IMPRESSION: Normal sound appearance of the kidneys. No hydronephrosis. Diffuse ascites. Enlarged prostate gland. Electronically Signed   By: Lucienne Capers M.D.   On: 09/17/2017 21:42    Scheduled Meds: . feeding supplement  1 Container Oral TID BM  . feeding supplement  (GLUCERNA SHAKE)  237 mL Oral BID BM  . Influenza vac split quadrivalent PF  0.5 mL Intramuscular Tomorrow-1000  . insulin aspart  0-9 Units Subcutaneous TID WC  . pneumococcal 23 valent vaccine  0.5 mL Intramuscular Tomorrow-1000  . polyethylene glycol  17 g Oral Daily  . QUEtiapine  400 mg Oral QHS  . senna-docusate  1 tablet Oral BID    Continuous Infusions: . sodium chloride 1,000 mL (09/18/17 0820)  . cefTRIAXone (ROCEPHIN)  IV Stopped (09/18/17 0115)     LOS: 4 days     Alma Friendly, MD [facility]   If 7PM-7AM, please contact night-coverage [email or web address] Password [email or web address] 09/18/2017, 1:06 PM

## 2017-09-19 ENCOUNTER — Inpatient Hospital Stay (HOSPITAL_COMMUNITY): Payer: Medicare Other

## 2017-09-19 ENCOUNTER — Encounter (HOSPITAL_COMMUNITY): Payer: Self-pay | Admitting: *Deleted

## 2017-09-19 LAB — COMPREHENSIVE METABOLIC PANEL
ALBUMIN: 2 g/dL — AB (ref 3.5–5.0)
ALK PHOS: 314 U/L — AB (ref 38–126)
ALT: 50 U/L (ref 17–63)
ANION GAP: 8 (ref 5–15)
AST: 89 U/L — AB (ref 15–41)
BILIRUBIN TOTAL: 20.8 mg/dL — AB (ref 0.3–1.2)
BUN: 19 mg/dL (ref 6–20)
CO2: 20 mmol/L — AB (ref 22–32)
Calcium: 9 mg/dL (ref 8.9–10.3)
Chloride: 103 mmol/L (ref 101–111)
Creatinine, Ser: 1.33 mg/dL — ABNORMAL HIGH (ref 0.61–1.24)
GFR calc Af Amer: >60 mL/min (ref >60)
GFR calc non Af Amer: 55 mL/min — ABNORMAL LOW (ref >60)
GLUCOSE: 166 mg/dL — AB (ref 65–99)
POTASSIUM: 4.1 mmol/L (ref 3.5–5.1)
Sodium: 131 mmol/L — ABNORMAL LOW (ref 135–145)
TOTAL PROTEIN: 7 g/dL (ref 6.5–8.1)

## 2017-09-19 LAB — CULTURE, BLOOD (ROUTINE X 2)
Culture: NO GROWTH
Culture: NO GROWTH
SPECIAL REQUESTS: ADEQUATE

## 2017-09-19 LAB — CBC WITH DIFFERENTIAL/PLATELET
BASOS ABS: 0 10*3/uL (ref 0.0–0.1)
BASOS PCT: 0 %
EOS ABS: 0.3 10*3/uL (ref 0.0–0.7)
EOS PCT: 4 %
HCT: 28.2 % — ABNORMAL LOW (ref 39.0–52.0)
Hemoglobin: 9.9 g/dL — ABNORMAL LOW (ref 13.0–17.0)
LYMPHS ABS: 1.3 10*3/uL (ref 0.7–4.0)
LYMPHS PCT: 20 %
MCH: 29.3 pg (ref 26.0–34.0)
MCHC: 35.1 g/dL (ref 30.0–36.0)
MCV: 83.4 fL (ref 78.0–100.0)
MONO ABS: 0.7 10*3/uL (ref 0.1–1.0)
MONOS PCT: 11 %
Neutro Abs: 4.2 10*3/uL (ref 1.7–7.7)
Neutrophils Relative %: 65 %
Platelets: 162 10*3/uL (ref 150–400)
RBC: 3.38 MIL/uL — AB (ref 4.22–5.81)
RDW: 19.3 % — ABNORMAL HIGH (ref 11.5–15.5)
WBC: 6.5 10*3/uL (ref 4.0–10.5)

## 2017-09-19 LAB — GLUCOSE, CAPILLARY
GLUCOSE-CAPILLARY: 150 mg/dL — AB (ref 65–99)
GLUCOSE-CAPILLARY: 133 mg/dL — AB (ref 65–99)
Glucose-Capillary: 150 mg/dL — ABNORMAL HIGH (ref 65–99)
Glucose-Capillary: 159 mg/dL — ABNORMAL HIGH (ref 65–99)

## 2017-09-19 LAB — URINE CULTURE

## 2017-09-19 LAB — PROTIME-INR
INR: 1.51
PROTHROMBIN TIME: 18.1 s — AB (ref 11.4–15.2)

## 2017-09-19 LAB — AMMONIA: AMMONIA: 48 umol/L — AB (ref 9–35)

## 2017-09-19 MED ORDER — LACTULOSE 10 GM/15ML PO SOLN
20.0000 g | Freq: Three times a day (TID) | ORAL | Status: DC
  Administered 2017-09-19 – 2017-09-22 (×7): 20 g via ORAL
  Filled 2017-09-19 (×8): qty 30

## 2017-09-19 MED ORDER — IOPAMIDOL (ISOVUE-370) INJECTION 76%
INTRAVENOUS | Status: AC
  Filled 2017-09-19: qty 100

## 2017-09-19 MED ORDER — IOPAMIDOL (ISOVUE-370) INJECTION 76%
100.0000 mL | Freq: Once | INTRAVENOUS | Status: AC | PRN
  Administered 2017-09-19: 100 mL via INTRAVENOUS

## 2017-09-19 NOTE — Care Management Important Message (Signed)
Important Message  Patient Details  Name: [name] [name] MRN: [id number] Date of Birth: 1951/01/18   Medicare Important Message Given:  Yes    Nathen May 09/19/2017, 10:38 AM

## 2017-09-19 NOTE — Consult Note (Signed)
Chief Complaint: Patient was seen in consultation today for liver lesion  Referring Physician(s): Dr. Wendee Beavers  Supervising Physician: Sandi Mariscal  Patient Status: [facility] - In-pt  History of Present Illness: [name] [name] is a 67 y.o. male with past medical history of bipolar disorder, DM, HTN, CVA who presented to [facility] ED with acute onset dizziness at home. Patient was also found to have scleral icterus.  In the ED, patient was tachycardic with elevated liver markers.   CT Abdomen/Pelvis 09/14/17 showed: Findings consistent with metastasis in the liver and in the lungs. Abnormal metastatic lymph nodes are also identified in the abdomen. There is thick wall stomach particularly in the mid to distal portion extending into the duodenum, neoplastic involvement is not excluded. There is also inseparable abnormal lymph nodes from the posterior aspect of had the pancreas, neoplastic involvement is not excluded.  Patient underwent ERCP 09/15/17 with brush biopsies which showed atypia but were not diagnostic.   IR consulted for liver lesion biopsy at the request of Dr. Wendee Beavers.  Case reviewed and approved by Dr. Pascal Lux.   Patient has been NPO today.   Past Medical History:  Diagnosis Date  . Bipolar 1 disorder ([place])   . Blood transfusion   . Stroke [facility])     Past Surgical History:  Procedure Laterality Date  . CORONARY ARTERY BYPASS GRAFT    . ERCP N/A 09/15/2017   Procedure: ENDOSCOPIC RETROGRADE CHOLANGIOPANCREATOGRAPHY (ERCP);  Surgeon: Doran Stabler, MD;  Location: [facility];  Service: Gastroenterology;  Laterality: N/A;  . KNEE ARTHROSCOPY      Allergies: Patient has no known allergies.  Medications: Prior to Admission medications   Medication Sig Start Date End Date Taking? Authorizing Provider  QUEtiapine (SEROQUEL XR) 400 MG 24 hr tablet Take 400 mg by mouth at bedtime.  09/08/17  Yes [provider]     History reviewed. No pertinent family  history.  Social History   Socioeconomic History  . Marital status: Divorced    Spouse name: None  . Number of children: None  . Years of education: None  . Highest education level: None  Social Needs  . Financial resource strain: None  . Food insecurity - worry: None  . Food insecurity - inability: None  . Transportation needs - medical: None  . Transportation needs - non-medical: None  Occupational History  . None  Tobacco Use  . Smoking status: Former Smoker    Packs/day: 1.00    Years: 20.00    Pack years: 20.00    Types: Cigarettes    Last attempt to quit: 09/19/2003    Years since quitting: 14.0  . Smokeless tobacco: Never Used  Substance and Sexual Activity  . Alcohol use: No    Comment: former  . Drug use: No  . Sexual activity: None  Other Topics Concern  . None  Social History Narrative  . None    Review of Systems  Constitutional: Negative for fatigue and fever.  Respiratory: Negative for cough and shortness of breath.   Cardiovascular: Negative for chest pain.  Gastrointestinal: Positive for abdominal pain.  Neurological: Positive for dizziness.  Psychiatric/Behavioral: Negative for behavioral problems and confusion.    Vital Signs: BP 130/76 (BP Location: Left Arm)   Pulse (!) 117   Temp 98.8 F (37.1 C) (Oral)   Resp 16   Ht 6' (1.829 m)   Wt 189 lb (85.7 kg)   SpO2 99%   BMI 25.63 kg/m  Physical Exam  Constitutional: He is oriented to person, place, and time. He appears well-developed.  Eyes: Scleral icterus is present.  Cardiovascular: Normal rate, regular rhythm and normal heart sounds.  Pulmonary/Chest: Effort normal and breath sounds normal. No respiratory distress.  Abdominal: Soft. There is no tenderness.  Neurological: He is alert and oriented to person, place, and time.  Skin: Skin is warm and dry.  Psychiatric: He has a normal mood and affect. His behavior is normal. Judgment and thought content normal.  Nursing note and  vitals reviewed.   Imaging: Ct Abdomen Pelvis W Contrast  Result Date: 09/14/2017 CLINICAL DATA:  Hematuria for 2 weeks. EXAM: CT ABDOMEN AND PELVIS WITH CONTRAST TECHNIQUE: Multidetector CT imaging of the abdomen and pelvis was performed using the standard protocol following bolus administration of intravenous contrast. CONTRAST:  125mL ISOVUE-300 IOPAMIDOL (ISOVUE-300) INJECTION 61% COMPARISON:  None. FINDINGS: Lower chest: Multiple nodules are identified in bilateral lung bases largest in the lateral left lung base measuring 7 mm. Scarring of right lung base is identified. The heart size is normal. Hepatobiliary: Multiple heterogeneously enhancing masses are identified within the liver. There is intrahepatic biliary ductal dilatation. No gallstone is identified. Pancreas: There multiple abnormal enlarged firm enhancing lymph nodes along the superior mesenteric and celiac axis. These abnormal lymph nodes are inseparable from the posterior aspect of the head the pancreas particularly on image 38 series 2. Spleen: Normal in size without focal abnormality. Adrenals/Urinary Tract: The adrenal glands are normal. There are small simple cysts in both kidneys. There is no hydronephrosis bilaterally. The bladder is partially decompressed without gross abnormality. Stomach/Bowel: There is a thick walled stomach particularly in the mid to distal portion extending into the duodenum. There is no small bowel obstruction or diverticulitis. Vascular/Lymphatic: Enlarged abnormal lymph nodes are identified along the celiac and superior mesenteric axis. There also smaller lymph nodes in the right lower quadrant and along the periaortic region. Atherosclerosis of the abdominal aorta is identified without aneurysmal dilatation. Reproductive: Prostate enlargement is noted. Other: Small ascites is identified in the abdomen and pelvis. Musculoskeletal: Degenerative joint changes of the spine are identified. IMPRESSION: Findings  consistent with metastasis in the liver and in the lungs. Abnormal metastatic lymph nodes are also identified in the abdomen. There is thick wall stomach particularly in the mid to distal portion extending into the duodenum, neoplastic involvement is not excluded. There is also inseparable abnormal lymph nodes from the posterior aspect of had the pancreas, neoplastic involvement is not excluded. Electronically Signed   By: Abelardo Diesel M.D.   On: 09/14/2017 15:35   US Renal  Result Date: 09/17/2017 CLINICAL DATA:  Acute kidney injury. EXAM: RENAL / URINARY TRACT ULTRASOUND COMPLETE COMPARISON:  CT abdomen pelvis 09/14/2017 FINDINGS: Right Kidney: Length: 11.4 cm. Echogenicity within normal limits. No mass or hydronephrosis visualized. Left Kidney: Length: 11 cm. Echogenicity within normal limits. No mass or hydronephrosis visualized. Bladder: Appears normal for degree of bladder distention. Prostate gland is enlarged, measuring 4.7 x 3.8 x 5.1 cm. Prostate makes impression on the bladder base. Moderate free fluid in the abdomen consistent with ascites. IMPRESSION: Normal sound appearance of the kidneys. No hydronephrosis. Diffuse ascites. Enlarged prostate gland. Electronically Signed   By: Lucienne Capers M.D.   On: 09/17/2017 21:42   Dg Ercp Biliary & Pancreatic Ducts  Result Date: 09/15/2017 CLINICAL DATA:  Gallstones EXAM: ERCP TECHNIQUE: Multiple spot images obtained with the fluoroscopic device and submitted for interpretation post-procedure. FLUOROSCOPY TIME:  Fluoroscopy Time:  7 minutes and 25 seconds Radiation Exposure Index (if provided by the fluoroscopic device): Number of Acquired Spot Images: 0 COMPARISON:  None. FINDINGS: Images demonstrate cannulation of the common bile duct and contrast filling the biliary tree. A biliary stent has been placed on the final image. Balloon stone retrieval is noted. There is narrowing of the upper common bile duct of unknown significance. IMPRESSION: See  above. These images were submitted for radiologic interpretation only. Please see the procedural report for the amount of contrast and the fluoroscopy time utilized. Electronically Signed   By: Marybelle Killings M.D.   On: 09/15/2017 12:30    Labs:  CBC: Recent Labs    09/14/17 1255 09/15/17 0223 09/18/17 0416 09/19/17 0402  WBC 5.4 5.3 7.6 6.5  HGB 12.2* 10.6* 9.9* 9.9*  HCT 35.6* 30.3* 27.7* 28.2*  PLT 155 141* 129* 162    COAGS: Recent Labs    09/14/17 1255  INR 1.35    BMP: Recent Labs    09/16/17 0343 09/17/17 0223 09/18/17 0416 09/19/17 0402  NA 134* 131* 131* 131*  K 3.9 3.8 3.7 4.1  CL 103 98* 101 103  CO2 20* 23 19* 20*  GLUCOSE 181* 168* 149* 166*  BUN 13 19 17 19   CALCIUM 9.0 8.7* 8.9 9.0  CREATININE 1.88* 2.47* 1.95* 1.33*  GFRNONAA 36* 26* 34* 55*  GFRAA 42* 30* 40* >60    LIVER FUNCTION TESTS: Recent Labs    09/16/17 0343 09/17/17 0223 09/18/17 0416 09/19/17 0402  BILITOT 22.5* 21.3* 21.0* 20.8*  AST 90* 110* 102* 89*  ALT 52 55 54 50  ALKPHOS 326* 312* 304* 314*  PROT 7.1 6.9 7.0 7.0  ALBUMIN 2.1* 2.1* 2.0* 2.0*    TUMOR MARKERS: No results for input(s): AFPTM, CEA, CA199, CHROMGRNA in the last 8760 hours.  Assessment and Plan: Patient with past medical history of DM, HTN, CVA presents with complaint of liver lesions suspicious for metastatic disease.  IR consulted for liver lesion biopsy at the request of Dr. Wendee Beavers. Case reviewed by Dr. Francena Hanly who approves patient for procedure.  Patient presents today in their usual state of health.  He has been NPO and is not currently on blood thinners.  Will order INR. Risks and benefits discussed with the patient including, but not limited to bleeding, infection, damage to adjacent structures or low yield requiring additional tests. All of the patient's questions were answered, patient is agreeable to proceed. Consent signed and in chart.  Thank you for this interesting consult.  I greatly enjoyed  meeting AUDY DAUPHINE and look forward to participating in their care.  A copy of this report was sent to the requesting provider on this date.  Electronically Signed: Docia Barrier, PA 09/19/2017, 10:27 AM   I spent a total of 40 Minutes    in face to face in clinical consultation, greater than 50% of which was counseling/coordinating care for liver lesion.

## 2017-09-19 NOTE — Progress Notes (Addendum)
PROGRESS NOTE  [name] [name] TKW:[id number] DOB: April 21, 1951 DOA: 09/14/2017 PCP: Lucia Gaskins, MD  HPI/Recap of past 24 hours: 67 y.o.malewith medical history significant for DM2, HTN, BPD, who presented to the ED with complaints of dizziness, such that hewas was unable to get up from his bed.Also noted abdominal pain of about 2 months duration worsened over the past 2 days with noted jaundice. Pt was found to have a bilirubin of 23. CT scan of abdomen revealed a metastatic pancreatic tumor with a large burden of peripancreatic adenopathy and large liver masses. GI subsequently consulted who performed ERCP which included stent placement in the common bile duct. Oncology on board, to follow up as an outpt. Pt admitted for further management  Today, pt still c/o mild abdominal pain, denies its worsening. Noted to have persistant tachycardia 110s, went up to 130s during PT. Poor appetite. Noted some confusion today. Denies chest pain, SOB, N/V/D, fever/chills.  Assessment/Plan: Principal Problem:   Abdominal pain Active Problems:   DM2 (diabetes mellitus, type 2) ([place])   BIPOLAR DISORDER UNSPECIFIED   Essential hypertension   Obstructive jaundice  #Abdominal pain/Obstructive jaundice Likely due to metastatic pancreatic CA Transaminitis, T.bili markedly elevated, AST/ALT/ALP rising, CA 19-9 elevated CT abdomen: metastatic pancreatic tumor with a large burden of peripancreatic adenopathy and large liver masses ERCP performed on 12/29: severeproximalbiliary stricture, proximal choledocholithiasis. Sphincterotomy performed but unable to remove stone due to stricture so plastic stent placed to CBD Brush cytology, non-diagnostic for malignancy Percutaneous liver biopsy pending by IR GI on board, appreciate further recs Oncology consulted, will follow pt as an outpt once diagnosis is confirmed Noted some confusion, will order ammonia level and start lactulose if  elevated  #Persistent tachycardia ?? R/O PE, dyspnea on exertion HR around 110, goes up to 130 on ambulation since admission High risk of PE due to ??malignancy Stat CT angio pending Continue hydration post CT angio due to CIN  #AKI Improving  Likely ??contrast induced Cr normal at baseline Continue NS at 100cc/hr Bladder scan, no retension USS, normal kidney, no hydronephrosis Strict I & O, avoid nephrotoxics Daily BMP If persistent, will consider nephrology  #??UTI - S/P IV Rocephin X 5 days - urine cultures showed yeast, no treatment needed except if clinical symptoms changes  #DM2 (diabetes mellitus, type 2) - SSI - Carb modified diet  #Bipolar - seroquel  #Essential hypertension - stable off antihypertensives     Code Status: Full  Family Communication: None at bedside  Disposition Plan: Home once work-up complete   Consultants:  GI  Oncology  IR  Procedures:  ERCP on 12/29  Antimicrobials:  IV Ceftriaxone  DVT prophylaxis:  SCDs for now due to upcoming biopsy   Objective: Vitals:   09/18/17 0448 09/18/17 1503 09/18/17 2126 09/19/17 0558  BP: 133/70 135/72 138/78 130/76  Pulse: (!) 113 (!) 110 (!) 109 (!) 117  Resp: 20 18 20 16   Temp: 98.4 F (36.9 C) 98.6 F (37 C) 98.4 F (36.9 C) 98.8 F (37.1 C)  TempSrc: Oral Oral Oral Oral  SpO2: 98% 98% 98% 99%  Weight:      Height:        Intake/Output Summary (Last 24 hours) at 09/19/2017 1317 Last data filed at 09/19/2017 1234 Gross per 24 hour  Intake 2075 ml  Output 725 ml  Net 1350 ml   Filed Weights   09/14/17 2035 09/14/17 2224 09/15/17 0849  Weight: 81.6 kg (180 lb) 85.8 kg (189 lb 2.5 oz)  85.7 kg (189 lb)    Exam:   General: Alert, awake, not in acute distress, weak  Cardiovascular: S1-S2 present, no added heart sounds  Respiratory: Diminished breath sounds at the bases, no wheezing noted  Abdomen: Soft, mild tenderness on lower abdomen, nondistended, bowel  sounds present  Musculoskeletal: No pedal edema bilaterally  Skin: Jaundiced  Psychiatry: Normal   Data Reviewed: CBC: Recent Labs  Lab 09/14/17 1255 09/15/17 0223 09/18/17 0416 09/19/17 0402  WBC 5.4 5.3 7.6 6.5  NEUTROABS 3.1  --   --  4.2  HGB 12.2* 10.6* 9.9* 9.9*  HCT 35.6* 30.3* 27.7* 28.2*  MCV 85.0 83.7 83.2 83.4  PLT 155 141* 129* 185   Basic Metabolic Panel: Recent Labs  Lab 09/15/17 0223 09/16/17 0343 09/17/17 0223 09/18/17 0416 09/19/17 0402  NA 135 134* 131* 131* 131*  K 3.8 3.9 3.8 3.7 4.1  CL 102 103 98* 101 103  CO2 22 20* 23 19* 20*  GLUCOSE 194* 181* 168* 149* 166*  BUN 5* 13 19 17 19   CREATININE 0.73 1.88* 2.47* 1.95* 1.33*  CALCIUM 8.9 9.0 8.7* 8.9 9.0   GFR: Estimated Creatinine Clearance: 60.8 mL/min (A) (by C-G formula based on SCr of 1.33 mg/dL (H)). Liver Function Tests: Recent Labs  Lab 09/15/17 0223 09/16/17 0343 09/17/17 0223 09/18/17 0416 09/19/17 0402  AST 79* 90* 110* 102* 89*  ALT 52 52 55 54 50  ALKPHOS 347* 326* 312* 304* 314*  BILITOT 21.5* 22.5* 21.3* 21.0* 20.8*  PROT 7.3 7.1 6.9 7.0 7.0  ALBUMIN 2.1* 2.1* 2.1* 2.0* 2.0*   Recent Labs  Lab 09/16/17 0343  LIPASE 45   Recent Labs  Lab 09/14/17 1256  AMMONIA 71*   Coagulation Profile: Recent Labs  Lab 09/14/17 1255 09/19/17 1210  INR 1.35 1.51   Cardiac Enzymes: No results for input(s): CKTOTAL, CKMB, CKMBINDEX, TROPONINI in the last 168 hours. BNP (last 3 results) No results for input(s): PROBNP in the last 8760 hours. HbA1C: No results for input(s): HGBA1C in the last 72 hours. CBG: Recent Labs  Lab 09/18/17 1219 09/18/17 1654 09/18/17 2132 09/19/17 0806 09/19/17 1225  GLUCAP 159* 217* 180* 150* 133*   Lipid Profile: No results for input(s): CHOL, HDL, LDLCALC, TRIG, CHOLHDL, LDLDIRECT in the last 72 hours. Thyroid Function Tests: No results for input(s): TSH, T4TOTAL, FREET4, T3FREE, THYROIDAB in the last 72 hours. Anemia Panel: No  results for input(s): VITAMINB12, FOLATE, FERRITIN, TIBC, IRON, RETICCTPCT in the last 72 hours. Urine analysis:    Component Value Date/Time   COLORURINE AMBER (A) 09/14/2017 1256   APPEARANCEUR CLOUDY (A) 09/14/2017 1256   LABSPEC 1.020 09/14/2017 1256   PHURINE 5.0 09/14/2017 1256   GLUCOSEU 50 (A) 09/14/2017 1256   HGBUR NEGATIVE 09/14/2017 1256   BILIRUBINUR MODERATE (A) 09/14/2017 1256   KETONESUR NEGATIVE 09/14/2017 1256   PROTEINUR 30 (A) 09/14/2017 1256   UROBILINOGEN 1.0 02/07/2013 1814   NITRITE NEGATIVE 09/14/2017 1256   LEUKOCYTESUR NEGATIVE 09/14/2017 1256   Sepsis Labs: @LABRCNTIP (procalcitonin:4,lacticidven:4)  ) Recent Results (from the past 240 hour(s))  Culture, blood (Routine x 2)     Status: None   Collection Time: 09/14/17 12:55 PM  Result Value Ref Range Status   Specimen Description   Final    BLOOD RIGHT HAND BOTTLES DRAWN AEROBIC AND ANAEROBIC   Special Requests Blood Culture adequate volume  Final   Culture NO GROWTH 5 DAYS  Final   Report Status 09/19/2017 FINAL  Final  Culture, blood (  Routine x 2)     Status: None   Collection Time: 09/14/17 12:55 PM  Result Value Ref Range Status   Specimen Description   Final    LEFT ANTECUBITAL BOTTLES DRAWN AEROBIC AND ANAEROBIC   Special Requests   Final    Blood Culture results may not be optimal due to an excessive volume of blood received in culture bottles   Culture NO GROWTH 5 DAYS  Final   Report Status 09/19/2017 FINAL  Final  Culture, Urine     Status: Abnormal   Collection Time: 09/18/17  4:46 AM  Result Value Ref Range Status   Specimen Description URINE, CLEAN CATCH  Final   Special Requests NONE  Final   Culture 80,000 COLONIES/mL YEAST HYPHAL ELEMENTS SEEN (A)  Final   Report Status 09/19/2017 FINAL  Final      Studies: No results found.  Scheduled Meds: . feeding supplement (GLUCERNA SHAKE)  237 mL Oral BID BM  . Influenza vac split quadrivalent PF  0.5 mL Intramuscular  Tomorrow-1000  . insulin aspart  0-9 Units Subcutaneous TID WC  . pneumococcal 23 valent vaccine  0.5 mL Intramuscular Tomorrow-1000  . polyethylene glycol  17 g Oral Daily  . QUEtiapine  400 mg Oral QHS  . senna-docusate  1 tablet Oral BID    Continuous Infusions: . sodium chloride 100 mL/hr at 09/19/17 0518  . cefTRIAXone (ROCEPHIN)  IV Stopped (09/19/17 0023)     LOS: 5 days     Alma Friendly, MD [facility]   If 7PM-7AM, please contact night-coverage [email or web address] Password [email or web address] 09/19/2017, 1:17 PM

## 2017-09-19 NOTE — Progress Notes (Signed)
Physical Therapy Treatment Patient Details Name: [name] [name] MRN: [id number] DOB: April 05, 1951 Today's Date: 09/19/2017    History of Present Illness 67 y.o. male with medical history significant for  DM2, HTN, BPD, who presented to the ED with complaints of dizziness, abdominal pains for the past 2 months. Pt was found to have a bilirubin of 23. CT scan of abdomen revealed a metastatic pancreatic tumor with a large burden of peripancreatic adenopathy and large liver masses.    PT Comments    Today's skilled session focused on increasing ambulation distance and stair negotiation. Pt with mild instability with gait, requiring BIL UEs on IV pole to steady. 1 LOB noted with pt able to self correct. Pt reported dizziness and SOB with ambulation. SpO2 at 92% and HR 130bpm. Will continue to follow acutely to increase functional mobility and activity tolerance.   Follow Up Recommendations  Home health PT;Supervision - Intermittent     Equipment Recommendations  (TBD)    Recommendations for Other Services       Precautions / Restrictions Precautions Precautions: Fall Restrictions Weight Bearing Restrictions: No    Mobility  Bed Mobility Overal bed mobility: Modified Independent             General bed mobility comments: increased time and use of rail to come to EOB  Transfers Overall transfer level: Needs assistance Equipment used: None Transfers: Sit to/from Stand Sit to Stand: Min guard         General transfer comment: min guard for safety, increased time and effort.  Ambulation/Gait Ambulation/Gait assistance: Min guard Ambulation Distance (Feet): 250 Feet Assistive device: (pushive IV pole w/ both hands) Gait Pattern/deviations: Decreased stride length;Drifts right/left;Narrow base of support Gait velocity: decreased   General Gait Details: modest instability noted, decreased gait speed, one standing rest break with c/o SOB and dizziness. SpO2 at 92% and HR at  130BPM   Stairs Stairs: Yes   Stair Management: Two rails;Step to pattern;Alternating pattern;Forwards Number of Stairs: 4 General stair comments: Pt able to negotiate steps with mild instability but only min guard required for safety. Pt ascended with alternating pattern and descended with step to pattern.  Wheelchair Mobility    Modified Rankin (Stroke Patients Only)       Balance Overall balance assessment: Needs assistance   Sitting balance-Leahy Scale: Good     Standing balance support: Single extremity supported Standing balance-Leahy Scale: Fair                              Cognition Arousal/Alertness: Awake/alert Behavior During Therapy: WFL for tasks assessed/performed Overall Cognitive Status: No family/caregiver present to determine baseline cognitive functioning                                        Exercises      General Comments        Pertinent Vitals/Pain Pain Assessment: No/denies pain Pain Intervention(s): Monitored during session    Home Living                      Prior Function            PT Goals (current goals can now be found in the care plan section) Acute Rehab PT Goals Patient Stated Goal: to find out what is going on PT Goal Formulation: With  patient Time For Goal Achievement: 10/01/17 Potential to Achieve Goals: Good Progress towards PT goals: Progressing toward goals    Frequency    Min 3X/week      PT Plan Current plan remains appropriate    Co-evaluation              AM-PAC PT "6 Clicks" Daily Activity  Outcome Measure  Difficulty turning over in bed (including adjusting bedclothes, sheets and blankets)?: None Difficulty moving from lying on back to sitting on the side of the bed? : A Little Difficulty sitting down on and standing up from a chair with arms (e.g., wheelchair, bedside commode, etc,.)?: A Little Help needed moving to and from a bed to chair (including  a wheelchair)?: A Little Help needed walking in hospital room?: A Little Help needed climbing 3-5 steps with a railing? : A Little 6 Click Score: 19    End of Session Equipment Utilized During Treatment: Gait belt Activity Tolerance: Patient tolerated treatment well Patient left: in chair;with call bell/phone within reach Nurse Communication: Mobility status PT Visit Diagnosis: Unsteadiness on feet (R26.81)     Time: 2353-6144 PT Time Calculation (min) (ACUTE ONLY): 29 min  Charges:  $Gait Training: 23-37 mins                    G CodesBenjiman Core, [place] Pager [phone] Acute Rehab   Allena Katz 09/19/2017, 10:45 AM

## 2017-09-20 ENCOUNTER — Inpatient Hospital Stay (HOSPITAL_COMMUNITY): Payer: Medicare Other

## 2017-09-20 DIAGNOSIS — R945 Abnormal results of liver function studies: Secondary | ICD-10-CM

## 2017-09-20 DIAGNOSIS — C7989 Secondary malignant neoplasm of other specified sites: Secondary | ICD-10-CM

## 2017-09-20 DIAGNOSIS — C801 Malignant (primary) neoplasm, unspecified: Secondary | ICD-10-CM

## 2017-09-20 HISTORY — PX: IR PARACENTESIS: IMG2679

## 2017-09-20 HISTORY — PX: IR PERCUTANEOUS TRANSHEPATIC CHOLANGIOGRAM: IMG6042

## 2017-09-20 LAB — GLUCOSE, CAPILLARY
GLUCOSE-CAPILLARY: 158 mg/dL — AB (ref 65–99)
GLUCOSE-CAPILLARY: 141 mg/dL — AB (ref 65–99)
GLUCOSE-CAPILLARY: 142 mg/dL — AB (ref 65–99)

## 2017-09-20 LAB — COMPREHENSIVE METABOLIC PANEL
ALK PHOS: 339 U/L — AB (ref 38–126)
ALT: 53 U/L (ref 17–63)
AST: 111 U/L — AB (ref 15–41)
Albumin: 2 g/dL — ABNORMAL LOW (ref 3.5–5.0)
Anion gap: 11 (ref 5–15)
BILIRUBIN TOTAL: 22.1 mg/dL — AB (ref 0.3–1.2)
BUN: 16 mg/dL (ref 6–20)
CALCIUM: 8.9 mg/dL (ref 8.9–10.3)
CHLORIDE: 104 mmol/L (ref 101–111)
CO2: 17 mmol/L — ABNORMAL LOW (ref 22–32)
CREATININE: 0.95 mg/dL (ref 0.61–1.24)
Glucose, Bld: 162 mg/dL — ABNORMAL HIGH (ref 65–99)
Potassium: 4.4 mmol/L (ref 3.5–5.1)
Sodium: 132 mmol/L — ABNORMAL LOW (ref 135–145)
TOTAL PROTEIN: 7.4 g/dL (ref 6.5–8.1)

## 2017-09-20 LAB — CBC WITH DIFFERENTIAL/PLATELET
BASOS ABS: 0 10*3/uL (ref 0.0–0.1)
Basophils Relative: 1 %
EOS PCT: 4 %
Eosinophils Absolute: 0.2 10*3/uL (ref 0.0–0.7)
HEMATOCRIT: 30.1 % — AB (ref 39.0–52.0)
HEMOGLOBIN: 10.7 g/dL — AB (ref 13.0–17.0)
LYMPHS ABS: 0.9 10*3/uL (ref 0.7–4.0)
LYMPHS PCT: 17 %
MCH: 29.6 pg (ref 26.0–34.0)
MCHC: 35.5 g/dL (ref 30.0–36.0)
MCV: 83.1 fL (ref 78.0–100.0)
Monocytes Absolute: 0.7 10*3/uL (ref 0.1–1.0)
Monocytes Relative: 13 %
Neutro Abs: 3.6 10*3/uL (ref 1.7–7.7)
Neutrophils Relative %: 65 %
Platelets: 154 10*3/uL (ref 150–400)
RBC: 3.62 MIL/uL — AB (ref 4.22–5.81)
RDW: 19.8 % — ABNORMAL HIGH (ref 11.5–15.5)
WBC: 5.4 10*3/uL (ref 4.0–10.5)

## 2017-09-20 LAB — BODY FLUID CELL COUNT WITH DIFFERENTIAL
EOS FL: 1 %
LYMPHS FL: 46 %
Monocyte-Macrophage-Serous Fluid: 29 % — ABNORMAL LOW (ref 50–90)
Neutrophil Count, Fluid: 24 % (ref 0–25)
Total Nucleated Cell Count, Fluid: 475 cu mm (ref 0–1000)

## 2017-09-20 LAB — AMMONIA: AMMONIA: 77 umol/L — AB (ref 9–35)

## 2017-09-20 LAB — PROTIME-INR
INR: 1.57
Prothrombin Time: 18.6 seconds — ABNORMAL HIGH (ref 11.4–15.2)

## 2017-09-20 MED ORDER — SODIUM CHLORIDE 0.9 % IV SOLN
INTRAVENOUS | Status: AC | PRN
  Administered 2017-09-20: 10 mL/h via INTRAVENOUS

## 2017-09-20 MED ORDER — MIDAZOLAM HCL 2 MG/2ML IJ SOLN
INTRAMUSCULAR | Status: AC
  Filled 2017-09-20: qty 4

## 2017-09-20 MED ORDER — SODIUM CHLORIDE 0.9 % IV SOLN: Freq: Once | INTRAVENOUS | Status: DC

## 2017-09-20 MED ORDER — MIDAZOLAM HCL 2 MG/2ML IJ SOLN
INTRAMUSCULAR | Status: AC | PRN
  Administered 2017-09-20: 0.5 mg via INTRAVENOUS
  Administered 2017-09-20: 1 mg via INTRAVENOUS

## 2017-09-20 MED ORDER — LIDOCAINE HCL (PF) 1 % IJ SOLN
INTRAMUSCULAR | Status: AC
  Filled 2017-09-20: qty 30

## 2017-09-20 MED ORDER — PIPERACILLIN-TAZOBACTAM 3.375 G IVPB 30 MIN
3.3750 g | Freq: Once | INTRAVENOUS | Status: AC
  Administered 2017-09-20: 3.375 g via INTRAVENOUS
  Filled 2017-09-20 (×2): qty 50

## 2017-09-20 MED ORDER — LIDOCAINE HCL 1 % IJ SOLN
INTRAMUSCULAR | Status: AC | PRN
  Administered 2017-09-20: 20 mL

## 2017-09-20 MED ORDER — FENTANYL CITRATE (PF) 100 MCG/2ML IJ SOLN
INTRAMUSCULAR | Status: AC | PRN
  Administered 2017-09-20: 50 ug via INTRAVENOUS
  Administered 2017-09-20 (×2): 25 ug via INTRAVENOUS

## 2017-09-20 MED ORDER — FENTANYL CITRATE (PF) 100 MCG/2ML IJ SOLN
INTRAMUSCULAR | Status: AC
  Filled 2017-09-20: qty 4

## 2017-09-20 MED ORDER — IOPAMIDOL (ISOVUE-300) INJECTION 61%
INTRAVENOUS | Status: AC
  Administered 2017-09-20: 30 mL
  Filled 2017-09-20: qty 50

## 2017-09-20 NOTE — Progress Notes (Signed)
PROGRESS NOTE  [name] [name] VEH:[id number] DOB: Dec 27, 1950 DOA: 09/14/2017 PCP: Lucia Gaskins, MD  HPI/Recap of past 24 hours: 67 y.o.male DM2, HTN, BPD, prior SI/HI who presented to the ED with complaints of dizziness, such that hewas was unable to get up from his bed.Also noted abdominal pain of about 2 months duration worsened over the past 2 days with noted jaundice. Pt was found to have a bilirubin of 23. CT scan of abdomen revealed a metastatic pancreatic tumor with a large burden of peripancreatic adenopathy and large liver masses. GI subsequently consulted who performed ERCP which included stent placement in the common bile duct. Oncology on board, to follow up as an outpt. Pt admitted for further management  Awake alert not hungry at lunch ate breakfast-no fever no chills passing stool-says it "ran through him"  Assessment/Plan: Principal Problem:   Abdominal pain Active Problems:   DM2 (diabetes mellitus, type 2) ([place])   BIPOLAR DISORDER UNSPECIFIED   Essential hypertension   Obstructive jaundice  #Abdominal pain/Obstructive jaundice Likely due to metastatic pancreatic CA Transaminitis, T.bili markedly elevated, AST/ALT/ALP rising, CA 19-9 elevated CT abdomen: metastatic pancreatic tumor with a large burden of peripancreatic adenopathy and large liver masses ERCP performed on 12/29: severeproximalbiliary stricture, proximal choledocholithiasis. 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  #AKI, Creat 1.88 peaked at 2.4  Improving ?contrast induced Cr normal at baseline Continue NS at 100cc/hr for now  #??UTI - S/P IV Rocephin X 5 days - urine cultures showed yeast, no treatment needed except if clinical symptoms changes  #DM2 (diabetes mellitus, type 2) - SSI - Carb modified diet  #Bipolar - seroquel  #Essential hypertension - stable off antihypertensives     Code Status: Full  Family Communication: None at bedside  Disposition Plan: Home once work-up complete   Consultants:  GI  Oncology  IR  Procedures:  ERCP on 12/29  Antimicrobials:  IV Ceftriaxone  DVT prophylaxis:  SCDs for now due to upcoming biopsy   Objective: Vitals:   09/19/17 0558 09/19/17 1317 09/19/17 2102 09/20/17 0616  BP: 130/76 137/73 132/71 135/84  Pulse: (!) 117 (!) 108 (!) 112 (!) 116  Resp: 16 17 18 20   Temp: 98.8 F (37.1 C) 98.2 F (36.8 C) 98 F (36.7 C) 98.5 F (36.9 C)  TempSrc: Oral Oral Oral Oral  SpO2: 99% 98% 98% 98%  Weight:      Height:        Intake/Output Summary (Last 24 hours) at 09/20/2017 0905 Last data filed at 09/20/2017 0600 Gross per 24 hour  Intake 1588.33 ml  Output 850 ml  Net 738.33 ml   Filed Weights   09/14/17 2035 09/14/17 2224 09/15/17 0849  Weight: 81.6 kg (180 lb) 85.8 kg (189 lb 2.5 oz) 85.7 kg (189 lb)    Exam:   Awake pleasant alert oriented in nad  Icteric, no pallor  No le edema  abd soft nt nd  no reboudn no guard  No asterixis,    Data Reviewed: CBC: Recent Labs  Lab 09/14/17 1255 09/15/17 0223 09/18/17 0416 09/19/17 0402  WBC 5.4 5.3 7.6 6.5  NEUTROABS 3.1  --   --  4.2  HGB 12.2* 10.6* 9.9* 9.9*  HCT 35.6* 30.3* 27.7* 28.2*  MCV 85.0 83.7 83.2 83.4  PLT 155 141* 129* 811   Basic Metabolic Panel: Recent Labs  Lab 09/15/17 0223 09/16/17 0343 09/17/17 0223 09/18/17 0416 09/19/17 0402  NA 135 134* 131* 131* 131*  K  3.8 3.9 3.8 3.7 4.1  CL 102 103 98* 101 103  CO2 22 20* 23 19* 20*  GLUCOSE 194* 181* 168* 149* 166*  BUN 5* 13 19 17 19   CREATININE 0.73 1.88* 2.47* 1.95* 1.33*  CALCIUM 8.9 9.0 8.7* 8.9 9.0   GFR: Estimated Creatinine Clearance: 60.8 mL/min (A) (by C-G formula based on SCr of 1.33 mg/dL (H)). Liver Function Tests: Recent Labs  Lab 09/15/17 0223 09/16/17 0343 09/17/17 0223 09/18/17 0416 09/19/17 0402  AST 79* 90* 110* 102* 89*  ALT 52 52 55 54 50  ALKPHOS 347* 326* 312* 304* 314*  BILITOT 21.5* 22.5* 21.3* 21.0* 20.8*  PROT 7.3 7.1 6.9 7.0 7.0  ALBUMIN 2.1* 2.1* 2.1* 2.0* 2.0*   Recent Labs  Lab 09/16/17 0343  LIPASE 45   Recent Labs  Lab 09/14/17 1256 09/19/17 1416 09/20/17 0409  AMMONIA 71* 48* 77*   Coagulation Profile: Recent Labs  Lab 09/14/17 1255 09/19/17 1210  INR 1.35 1.51   Cardiac Enzymes: No results for input(s): CKTOTAL, CKMB, CKMBINDEX, TROPONINI in the last 168 hours. BNP (last 3 results) No results for input(s): PROBNP in the last 8760 hours. HbA1C: No results for input(s): HGBA1C in the last 72 hours. CBG: Recent Labs  Lab 09/19/17 0806 09/19/17 1225 09/19/17 1630 09/19/17 2100 09/20/17 0753  GLUCAP 150* 133* 150* 159* 158*   Lipid Profile: No results for input(s): CHOL, HDL, LDLCALC, TRIG, CHOLHDL, LDLDIRECT in the last 72 hours. Thyroid Function Tests: No results for input(s): TSH, T4TOTAL, FREET4, T3FREE, THYROIDAB in the last 72 hours. Anemia Panel: No results for input(s): VITAMINB12, FOLATE, FERRITIN, TIBC, IRON, RETICCTPCT in the last 72 hours. Urine analysis:    Component Value Date/Time   COLORURINE AMBER (A) 09/14/2017 1256   APPEARANCEUR CLOUDY (A) 09/14/2017 1256   LABSPEC 1.020 09/14/2017 1256   PHURINE 5.0 09/14/2017 1256   GLUCOSEU 50 (A) 09/14/2017 1256   HGBUR NEGATIVE 09/14/2017 1256   BILIRUBINUR MODERATE (A) 09/14/2017 1256   KETONESUR NEGATIVE 09/14/2017 1256   PROTEINUR 30 (A) 09/14/2017 1256    UROBILINOGEN 1.0 02/07/2013 1814   NITRITE NEGATIVE 09/14/2017 1256   LEUKOCYTESUR NEGATIVE 09/14/2017 1256   Sepsis Labs: @LABRCNTIP (procalcitonin:4,lacticidven:4)  ) Recent Results (from the past 240 hour(s))  Culture, blood (Routine x 2)     Status: None   Collection Time: 09/14/17 12:55 PM  Result Value Ref Range Status   Specimen Description   Final    BLOOD RIGHT HAND BOTTLES DRAWN AEROBIC AND ANAEROBIC   Special Requests Blood Culture adequate volume  Final   Culture NO GROWTH 5 DAYS  Final   Report Status 09/19/2017 FINAL  Final  Culture, blood (Routine x 2)     Status: None   Collection Time: 09/14/17 12:55 PM  Result Value Ref Range Status   Specimen Description   Final    LEFT ANTECUBITAL BOTTLES DRAWN AEROBIC AND ANAEROBIC   Special Requests   Final  Blood Culture results may not be optimal due to an excessive volume of blood received in culture bottles   Culture NO GROWTH 5 DAYS  Final   Report Status 09/19/2017 FINAL  Final  Culture, Urine     Status: Abnormal   Collection Time: 09/18/17  4:46 AM  Result Value Ref Range Status   Specimen Description URINE, CLEAN CATCH  Final   Special Requests NONE  Final   Culture 80,000 COLONIES/mL YEAST HYPHAL ELEMENTS SEEN (A)  Final   Report Status 09/19/2017 FINAL  Final      Studies: Ct Angio Chest Pe W Or Wo Contrast  Result Date: 09/19/2017 CLINICAL DATA:  Shortness of breath. Chest fluttering. Recently diagnosed pancreatic cancer. EXAM: CT ANGIOGRAPHY CHEST WITH CONTRAST TECHNIQUE: Multidetector CT imaging of the chest was performed using the standard protocol during bolus administration of intravenous contrast. Multiplanar CT image reconstructions and MIPs were obtained to evaluate the vascular anatomy. CONTRAST:  13mL ISOVUE-370 IOPAMIDOL (ISOVUE-370) INJECTION 76% COMPARISON:  Chest radiographs 05/02/2010. CT abdomen and pelvis 09/14/2017. FINDINGS: Cardiovascular: Pulmonary arterial opacification is suboptimal, and  there is also motion artifact which limits assessment (particularly in the left lower lobe). No main or lobar pulmonary artery emboli are identified, and no definite segmental emboli are identified within these limitations. The thoracic aorta is normal in caliber without evidence of aneurysm or dissection. Mild aortic atherosclerosis is noted. Left circumflex and right coronary artery atherosclerosis is noted. There is no pericardial effusion. Mediastinum/Nodes: Subcentimeter right hilar lymph nodes. No enlarged mediastinal or axillary lymph nodes. Unremarkable thyroid. Nondistended esophagus. Lungs/Pleura: Small left pleural effusion has increased in size from the prior abdominal CT. A small right pleural effusion is new. Evaluation of the lung parenchyma is limited by respiratory motion artifact. Numerous small nodules are present throughout both lungs, many of which were included on the recent abdominal CT. The largest measures 11 x 10 mm in the right middle lobe (series 6, image 69, previously 9 x 7 mm). Chronic scarring is noted in the right lung base. There is subsegmental atelectasis in both lung bases. There is also confluent, predominantly ground-glass opacity posteriorly in the right upper lobe. Upper Abdomen: Partially visualized perihepatic free fluid. Partially visualized liver mass and upper abdominal lymphadenopathy as seen on recent abdominal CT. Musculoskeletal: No suspicious osseous lesion. Review of the MIP images confirms the above findings. IMPRESSION: 1. Motion degraded examination. No central pulmonary emboli identified. 2. Small bilateral pleural effusions. 3. Posterior right upper lobe ground-glass opacity which may reflect pneumonia. 4. Numerous bilateral lung nodules concerning for metastases. 5. Liver mass and upper abdominal lymphadenopathy consistent with metastatic disease, more fully evaluated on recent abdominal CT. 6. Ascites. 7. Aortic Atherosclerosis (ICD10-I70.0). Electronically  Signed   By: Logan Bores M.D.   On: 09/19/2017 18:01    Scheduled Meds: . feeding supplement (GLUCERNA SHAKE)  237 mL Oral BID BM  . Influenza vac split quadrivalent PF  0.5 mL Intramuscular Tomorrow-1000  . insulin aspart  0-9 Units Subcutaneous TID WC  . lactulose  20 g Oral TID  . pneumococcal 23 valent vaccine  0.5 mL Intramuscular Tomorrow-1000  . polyethylene glycol  17 g Oral Daily  . QUEtiapine  400 mg Oral QHS  . senna-docusate  1 tablet Oral BID    Continuous Infusions: . sodium chloride 100 mL/hr at 09/19/17 1514     LOS: 6 days     Nita Sells, MD [facility]   If 7PM-7AM, please contact night-coverage [email or web address] Password  [email or web address] 09/20/2017, 9:05 AM

## 2017-09-20 NOTE — Care Management Note (Signed)
Case Management Note Marvetta Gibbons RN, BSN Unit 4E-Case Manager-- 5W coverage on 09/20/17 [phone]  Patient Details  Name: [name] [name] MRN: [id number] Date of Birth: [birth year]  Subjective/Objective:   Pt admitted with Abdominal pain/Obstructive jaundice Likely due to metastatic pancreatic CA, s/p ERCP on 12/29                 Action/Plan: PTA pt lived at home- PT eval ordered and recommendation for HHPT- will need HH order with F2F prior to discharge-  CM to follow for transition of care needs.   Expected Discharge Date:                  Expected Discharge Plan:  [place]  In-House Referral:     Discharge planning Services  CM Consult  Post Acute Care Choice:  Home Health Choice offered to:     DME Arranged:    DME Agency:     HH Arranged:    [place] Agency:     Status of Service:  In process, will continue to follow  If discussed at Long Length of Stay Meetings, dates discussed:    Discharge Disposition:   Additional Comments:  Dawayne Patricia, RN 09/20/2017, 10:20 AM

## 2017-09-20 NOTE — Progress Notes (Signed)
PROGRESS NOTE  [name] [name]:[id number] DOB: [birth year] DOA: 09/14/2017 PCP: Lucia Gaskins, MD  HPI/Recap of past 24 hours: 67 y.o.male DM2, HTN, BPD, who presented to the ED with complaints of dizziness, such that hewas was unable to get up from his bed + abd pain 2 months duration worsened over the past 2 days with noted jaundice. Pt was found to have a bilirubin of 23. CT scan of abdomen revealed a metastatic pancreatic tumor with a large burden of peripancreatic adenopathy and large liver masses. GI subsequently consulted who performed ERCP which included stent placement in the common bile duct. Oncology on board, to follow up as an outpt. Pt admitted for further management  Today, pt still c/o mild abdominal pain, denies its worsening. Noted to have persistant tachycardia 110s, went up to 130s during PT. Poor appetite. Noted some confusion today. Denies chest pain, SOB, N/V/D, fever/chills.  Assessment/Plan: Principal Problem:   Abdominal pain Active Problems:   DM2 (diabetes mellitus, type 2) ([place])   BIPOLAR DISORDER UNSPECIFIED   Essential hypertension   Obstructive jaundice  #Abdominal pain/Obstructive jaundice Transaminitis, T.bili markedly elevated, AST/ALT/ALP rising, CA 19-9 elevated, 2985 CT abdomen: metastatic pancreatic tumor with a large burden of peripancreatic adenopathy and large liver masses ERCP performed on 12/29: severeproximalbiliary stricture, proximal choledocholithiasis. 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  #AKI, probably  contrast induced Improving  USS, normal kidney, no hydronephrosis Strict I & O, avoid nephrotoxics Daily BMP  #??UTI - S/P IV Rocephin X 5 days - urine cultures showed yeast, no treatment needed except if clinical symptoms changes  #DM2 (diabetes mellitus, type 2) - SSI - Carb modified diet  #Bipolar - seroquel  #Essential hypertension - stable off antihypertensives     Code Status: Full  Family Communication: None at bedside  Disposition Plan: Home once work-up complete   Consultants:  GI  Oncology  IR  Procedures:  ERCP on 12/29  Antimicrobials:  IV Ceftriaxone  DVT prophylaxis:  SCDs for now due to upcoming biopsy   Objective: Vitals:   09/20/17 0616 09/20/17 1322 09/20/17 1455 09/20/17 1528  BP: 135/84 (!) 147/75 124/80 124/85  Pulse: (!) 116 (!) 109 (!) 110 (!) 110  Resp: 20  18 18   Temp: 98.5 F (36.9 C) 97.9 F (36.6 C) 97.8 F (36.6 C) 97.6 F (36.4 C)  TempSrc: Oral Oral Oral Oral  SpO2: 98% 96% 94% 93%  Weight:      Height:        Intake/Output Summary (Last 24 hours) at 09/20/2017 1542 Last data filed at 09/20/2017 1452 Gross per 24 hour  Intake 465 ml  Output 1100 ml  Net -635 ml   Filed Weights   09/14/17 2035 09/14/17 2224 09/15/17 0849  Weight: 81.6 kg (180 lb) 85.8 kg (189 lb 2.5 oz) 85.7 kg (189 lb)    Exam:  Weak alert pleasant in nad Wants to eat  no cp cta b abd soft nt nd no rebound no guard No tremor nor asterixis  Data Reviewed: CBC: Recent Labs  Lab 09/14/17 1255 09/15/17 0223 09/18/17 0416 09/19/17  0402 09/20/17 0409  WBC 5.4 5.3 7.6 6.5 5.4  NEUTROABS 3.1  --   --  4.2 3.6  HGB 12.2* 10.6* 9.9* 9.9* 10.7*  HCT 35.6* 30.3* 27.7* 28.2* 30.1*  MCV 85.0 83.7 83.2 83.4 83.1  PLT 155 141* 129* 162 846   Basic Metabolic Panel: Recent Labs  Lab 09/16/17 0343 09/17/17 0223 09/18/17 0416 09/19/17 0402 09/20/17 0409  NA 134* 131* 131* 131* 132*  K 3.9 3.8 3.7 4.1 4.4  CL 103 98* 101 103 104   CO2 20* 23 19* 20* 17*  GLUCOSE 181* 168* 149* 166* 162*  BUN 13 19 17 19 16   CREATININE 1.88* 2.47* 1.95* 1.33* 0.95  CALCIUM 9.0 8.7* 8.9 9.0 8.9   GFR: Estimated Creatinine Clearance: 85.1 mL/min (by C-G formula based on SCr of 0.95 mg/dL). Liver Function Tests: Recent Labs  Lab 09/16/17 0343 09/17/17 0223 09/18/17 0416 09/19/17 0402 09/20/17 0409  AST 90* 110* 102* 89* 111*  ALT 52 55 54 50 53  ALKPHOS 326* 312* 304* 314* 339*  BILITOT 22.5* 21.3* 21.0* 20.8* 22.1*  PROT 7.1 6.9 7.0 7.0 7.4  ALBUMIN 2.1* 2.1* 2.0* 2.0* 2.0*   Recent Labs  Lab 09/16/17 0343  LIPASE 45   Recent Labs  Lab 09/14/17 1256 09/19/17 1416 09/20/17 0409  AMMONIA 71* 48* 77*   Coagulation Profile: Recent Labs  Lab 09/14/17 1255 09/19/17 1210 09/20/17 1055  INR 1.35 1.51 1.57   Cardiac Enzymes: No results for input(s): CKTOTAL, CKMB, CKMBINDEX, TROPONINI in the last 168 hours. BNP (last 3 results) No results for input(s): PROBNP in the last 8760 hours. HbA1C: No results for input(s): HGBA1C in the last 72 hours. CBG: Recent Labs  Lab 09/19/17 1225 09/19/17 1630 09/19/17 2100 09/20/17 0753 09/20/17 1200  GLUCAP 133* 150* 159* 158* 141*   Lipid Profile: No results for input(s): CHOL, HDL, LDLCALC, TRIG, CHOLHDL, LDLDIRECT in the last 72 hours. Thyroid Function Tests: No results for input(s): TSH, T4TOTAL, FREET4, T3FREE, THYROIDAB in the last 72 hours. Anemia Panel: No results for input(s): VITAMINB12, FOLATE, FERRITIN, TIBC, IRON, RETICCTPCT in the last 72 hours. Urine analysis:    Component Value Date/Time   COLORURINE AMBER (A) 09/14/2017 1256   APPEARANCEUR CLOUDY (A) 09/14/2017 1256   LABSPEC 1.020 09/14/2017 1256   PHURINE 5.0 09/14/2017 1256   GLUCOSEU 50 (A) 09/14/2017 1256   HGBUR NEGATIVE 09/14/2017 1256   BILIRUBINUR MODERATE (A) 09/14/2017 1256   KETONESUR NEGATIVE 09/14/2017 1256   PROTEINUR 30 (A) 09/14/2017 1256   UROBILINOGEN 1.0 02/07/2013 1814    NITRITE NEGATIVE 09/14/2017 1256   LEUKOCYTESUR NEGATIVE 09/14/2017 1256   Sepsis Labs: @LABRCNTIP (procalcitonin:4,lacticidven:4)  ) Recent Results (from the past 240 hour(s))  Culture, blood (Routine x 2)     Status: None   Collection Time: 09/14/17 12:55 PM  Result Value Ref Range Status   Specimen Description   Final    BLOOD RIGHT HAND BOTTLES DRAWN AEROBIC AND ANAEROBIC   Special Requests Blood Culture adequate volume  Final   Culture NO GROWTH 5 DAYS  Final   Report Status 09/19/2017 FINAL  Final  Culture, blood (Routine x 2)     Status: None   Collection Time: 09/14/17 12:55 PM  Result Value Ref Range Status   Specimen Description   Final    LEFT ANTECUBITAL BOTTLES DRAWN AEROBIC AND ANAEROBIC   Special Requests   Final    Blood Culture results may not be optimal due to an excessive volume of  blood received in culture bottles   Culture NO GROWTH 5 DAYS  Final   Report Status 09/19/2017 FINAL  Final  Culture, Urine     Status: Abnormal   Collection Time: 09/18/17  4:46 AM  Result Value Ref Range Status   Specimen Description URINE, CLEAN CATCH  Final   Special Requests NONE  Final   Culture 80,000 COLONIES/mL YEAST HYPHAL ELEMENTS SEEN (A)  Final   Report Status 09/19/2017 FINAL  Final      Studies: Ct Angio Chest Pe W Or Wo Contrast  Result Date: 09/19/2017 CLINICAL DATA:  Shortness of breath. Chest fluttering. Recently diagnosed pancreatic cancer. EXAM: CT ANGIOGRAPHY CHEST WITH CONTRAST TECHNIQUE: Multidetector CT imaging of the chest was performed using the standard protocol during bolus administration of intravenous contrast. Multiplanar CT image reconstructions and MIPs were obtained to evaluate the vascular anatomy. CONTRAST:  181mL ISOVUE-370 IOPAMIDOL (ISOVUE-370) INJECTION 76% COMPARISON:  Chest radiographs 05/02/2010. CT abdomen and pelvis 09/14/2017. FINDINGS: Cardiovascular: Pulmonary arterial opacification is suboptimal, and there is also motion artifact which  limits assessment (particularly in the left lower lobe). No main or lobar pulmonary artery emboli are identified, and no definite segmental emboli are identified within these limitations. The thoracic aorta is normal in caliber without evidence of aneurysm or dissection. Mild aortic atherosclerosis is noted. Left circumflex and right coronary artery atherosclerosis is noted. There is no pericardial effusion. Mediastinum/Nodes: Subcentimeter right hilar lymph nodes. No enlarged mediastinal or axillary lymph nodes. Unremarkable thyroid. Nondistended esophagus. Lungs/Pleura: Small left pleural effusion has increased in size from the prior abdominal CT. A small right pleural effusion is new. Evaluation of the lung parenchyma is limited by respiratory motion artifact. Numerous small nodules are present throughout both lungs, many of which were included on the recent abdominal CT. The largest measures 11 x 10 mm in the right middle lobe (series 6, image 69, previously 9 x 7 mm). Chronic scarring is noted in the right lung base. There is subsegmental atelectasis in both lung bases. There is also confluent, predominantly ground-glass opacity posteriorly in the right upper lobe. Upper Abdomen: Partially visualized perihepatic free fluid. Partially visualized liver mass and upper abdominal lymphadenopathy as seen on recent abdominal CT. Musculoskeletal: No suspicious osseous lesion. Review of the MIP images confirms the above findings. IMPRESSION: 1. Motion degraded examination. No central pulmonary emboli identified. 2. Small bilateral pleural effusions. 3. Posterior right upper lobe ground-glass opacity which may reflect pneumonia. 4. Numerous bilateral lung nodules concerning for metastases. 5. Liver mass and upper abdominal lymphadenopathy consistent with metastatic disease, more fully evaluated on recent abdominal CT. 6. Ascites. 7. Aortic Atherosclerosis (ICD10-I70.0). Electronically Signed   By: Logan Bores M.D.   On:  09/19/2017 18:01    Scheduled Meds: . feeding supplement (GLUCERNA SHAKE)  237 mL Oral BID BM  . Influenza vac split quadrivalent PF  0.5 mL Intramuscular Tomorrow-1000  . insulin aspart  0-9 Units Subcutaneous TID WC  . lactulose  20 g Oral TID  . pneumococcal 23 valent vaccine  0.5 mL Intramuscular Tomorrow-1000  . polyethylene glycol  17 g Oral Daily  . QUEtiapine  400 mg Oral QHS  . senna-docusate  1 tablet Oral BID    Continuous Infusions: . sodium chloride 100 mL/hr at 09/20/17 1231  . sodium chloride    . piperacillin-tazobactam       LOS: 6 days     Nita Sells, MD [facility]   If 7PM-7AM, please contact night-coverage [email or web address] Password [facility] 09/20/2017,  3:42 PM

## 2017-09-20 NOTE — Procedures (Addendum)
  Pre-operative Diagnosis: Liver lesions and elevated bilirubin level despite ERCP and stent placement       Post-operative Diagnosis: Liver lesions, ? Central liver mass/gallbladder mass.  No significant biliary dilatation and endoscopic stent is patent.   Indications: Persistent elevated bilirubin after endoscopic stent, ascites and poor venous access for procedure.  Procedure: Right arm PICC, US paracentesis, PTC  Findings: PICC tip in lower SVC Removed 1 liter of cloudy yellow/orange fluid.  No significant biliary dilatation on [place].  PTC demonstrated decompressed but irregular intrahepatic ducts.  Contrast drained thru a patent endoscopic stent. Perihepatic ascites immediately re-accumulated after paracentesis catheter removed.  Complications: None     EBL: Minimal  Plan: Bedrest tonight.  Need to discuss with GI.  Probably needs liver MR or repeat CT with pancreatic/liver protocol to better evaluate liver disease and possible portal vein thrombosis.

## 2017-09-20 NOTE — Progress Notes (Signed)
Progress Note   Subjective  Pt reports RUQ pain last night which has completely resolved Denies abd pain now No nausea or vomiting No fever Diffuse itching Feels weak Decreased appetite but states "I could eat"    Objective  Vital signs in last 24 hours: Temp:  [97.9 F (36.6 C)-98.5 F (36.9 C)] 97.9 F (36.6 C) (01/03 1322) Pulse Rate:  [109-116] 109 (01/03 1322) Resp:  [18-20] 20 (01/03 0616) BP: (132-147)/(71-84) 147/75 (01/03 1322) SpO2:  [96 %-98 %] 96 % (01/03 1322) Last BM Date: 09/19/17  General: Alert, well-developed, chronically ill appearing in NAD HEENT: icteric sclera Heart:  Regular rate and borderline tachy no murmurs Chest: decreased in b/l bases Abdomen:  Soft, nontender and mildly distended, not tense. Normal bowel sounds, without guarding, and without rebound.   Extremities:  1+ LE edema  Neurologic:  Alert and  oriented x4; grossly normal neurologically. No asterixis Psych:  Alert and cooperative. Normal mood and affect.  Intake/Output from previous day: 01/02 0701 - 01/03 0700 In: 1588.3 [P.O.:120; I.V.:1468.3] Out: 850 [Urine:850] Intake/Output this shift: Total I/O In: -  Out: 200 [Urine:200]  Lab Results: Recent Labs    09/18/17 0416 09/19/17 0402 09/20/17 0409  WBC 7.6 6.5 5.4  HGB 9.9* 9.9* 10.7*  HCT 27.7* 28.2* 30.1*  PLT 129* 162 154   BMET Recent Labs    09/18/17 0416 09/19/17 0402 09/20/17 0409  NA 131* 131* 132*  K 3.7 4.1 4.4  CL 101 103 104  CO2 19* 20* 17*  GLUCOSE 149* 166* 162*  BUN _0 CREATININE 1.95* 1.33* 0.95  CALCIUM 8.9 9.0 8.9   LFT Recent Labs    09/20/17 0409  PROT 7.4  ALBUMIN 2.0*  AST 111*  ALT 53  ALKPHOS 339*  BILITOT 22.1*   PT/INR Recent Labs    09/19/17 1210 09/20/17 1055  LABPROT 18.1* 18.6*  INR 1.51 1.57    Studies/Results: Ct Angio Chest Pe W Or Wo Contrast  Result Date: 09/19/2017 CLINICAL DATA:  Shortness of breath. Chest fluttering. Recently diagnosed  pancreatic cancer. EXAM: CT ANGIOGRAPHY CHEST WITH CONTRAST TECHNIQUE: Multidetector CT imaging of the chest was performed using the standard protocol during bolus administration of intravenous contrast. Multiplanar CT image reconstructions and MIPs were obtained to evaluate the vascular anatomy. CONTRAST:  158m ISOVUE-370 IOPAMIDOL (ISOVUE-370) INJECTION 76% COMPARISON:  Chest radiographs 05/02/2010. CT abdomen and pelvis 09/14/2017. FINDINGS: Cardiovascular: Pulmonary arterial opacification is suboptimal, and there is also motion artifact which limits assessment (particularly in the left lower lobe). No main or lobar pulmonary artery emboli are identified, and no definite segmental emboli are identified within these limitations. The thoracic aorta is normal in caliber without evidence of aneurysm or dissection. Mild aortic atherosclerosis is noted. Left circumflex and right coronary artery atherosclerosis is noted. There is no pericardial effusion. Mediastinum/Nodes: Subcentimeter right hilar lymph nodes. No enlarged mediastinal or axillary lymph nodes. Unremarkable thyroid. Nondistended esophagus. Lungs/Pleura: Small left pleural effusion has increased in size from the prior abdominal CT. A small right pleural effusion is new. Evaluation of the lung parenchyma is limited by respiratory motion artifact. Numerous small nodules are present throughout both lungs, many of which were included on the recent abdominal CT. The largest measures 11 x 10 mm in the right middle lobe (series 6, image 69, previously 9 x 7 mm). Chronic scarring is noted in the right lung base. There is subsegmental atelectasis in both lung bases. There is also confluent, predominantly  ground-glass opacity posteriorly in the right upper lobe. Upper Abdomen: Partially visualized perihepatic free fluid. Partially visualized liver mass and upper abdominal lymphadenopathy as seen on recent abdominal CT. Musculoskeletal: No suspicious osseous lesion.  Review of the MIP images confirms the above findings. IMPRESSION: 1. Motion degraded examination. No central pulmonary emboli identified. 2. Small bilateral pleural effusions. 3. Posterior right upper lobe ground-glass opacity which may reflect pneumonia. 4. Numerous bilateral lung nodules concerning for metastases. 5. Liver mass and upper abdominal lymphadenopathy consistent with metastatic disease, more fully evaluated on recent abdominal CT. 6. Ascites. 7. Aortic Atherosclerosis (ICD10-I70.0). Electronically Signed   By: Logan Bores M.D.   On: 09/19/2017 18:01      Assessment & Plan  67 yo male with upper abd malignancy of unclear origin here with biliary obstruction and widely metastatic process by imaging  1. Biliary obstruction/elevated LFTs/intraabd metastatic malignancy -- despite ERCP on 09/15/2017 he has not had significant decline in liver enzymes.  I have discussed the case with Dr. Loletha Carrow who performed the ERCP and Dr. Anselm Pancoast with IR today.  Dr. Anselm Pancoast and I reviewed imaging and discussed the plan. Clearly Mr. Batalla has a widely metastatic process and continues to have a biliary obstruction.  We have not yet been able to make a tissue diagnosis.  On reviewing imaging today there is question of possible PV thrombosis and the question of possible gallbladder (rather than pancreatic head) cancer was raised.  Either way, we need to decompress the biliary system ASAP. --plan 2 u FFR now --percutaneous biliary drain with Dr. Anselm Pancoast today --Paracentesis sent for cell count and cytology today at time of PTC --Monitor LFTs --Will eventually need oncology involvement, but prognosis is very poor with limited overall life expectancy due to widely metastatic malignancy --Consider liver US with doppler after PTC to investigate patency of PV            LOS: 6 days   [name] [name]  09/20/2017, 2:22 PM

## 2017-09-21 ENCOUNTER — Encounter (HOSPITAL_COMMUNITY): Payer: Self-pay | Admitting: Diagnostic Radiology

## 2017-09-21 ENCOUNTER — Inpatient Hospital Stay (HOSPITAL_COMMUNITY): Payer: Medicare Other

## 2017-09-21 DIAGNOSIS — K831 Obstruction of bile duct: Secondary | ICD-10-CM

## 2017-09-21 LAB — COMPREHENSIVE METABOLIC PANEL
ALBUMIN: 2.2 g/dL — AB (ref 3.5–5.0)
ALK PHOS: 361 U/L — AB (ref 38–126)
ALT: 57 U/L (ref 17–63)
AST: 126 U/L — AB (ref 15–41)
Anion gap: 9 (ref 5–15)
BILIRUBIN TOTAL: 22.8 mg/dL — AB (ref 0.3–1.2)
BUN: 12 mg/dL (ref 6–20)
CALCIUM: 9.1 mg/dL (ref 8.9–10.3)
CO2: 19 mmol/L — ABNORMAL LOW (ref 22–32)
CREATININE: 0.83 mg/dL (ref 0.61–1.24)
Chloride: 105 mmol/L (ref 101–111)
GFR calc Af Amer: >60 mL/min (ref >60)
GFR calc non Af Amer: >60 mL/min (ref >60)
GLUCOSE: 167 mg/dL — AB (ref 65–99)
Potassium: 5.3 mmol/L — ABNORMAL HIGH (ref 3.5–5.1)
Sodium: 133 mmol/L — ABNORMAL LOW (ref 135–145)
TOTAL PROTEIN: 7.5 g/dL (ref 6.5–8.1)

## 2017-09-21 LAB — BPAM FFP
Blood Product Expiration Date: 201901042359
Blood Product Expiration Date: 201901062359
ISSUE DATE / TIME: 201901031458
ISSUE DATE / TIME: 201901031621
Unit Type and Rh: 6200
Unit Type and Rh: 6200

## 2017-09-21 LAB — CBC
HEMATOCRIT: 29 % — AB (ref 39.0–52.0)
Hemoglobin: 9.8 g/dL — ABNORMAL LOW (ref 13.0–17.0)
MCH: 28.4 pg (ref 26.0–34.0)
MCHC: 33.8 g/dL (ref 30.0–36.0)
MCV: 84.1 fL (ref 78.0–100.0)
PLATELETS: 178 10*3/uL (ref 150–400)
RBC: 3.45 MIL/uL — ABNORMAL LOW (ref 4.22–5.81)
RDW: 20.1 % — AB (ref 11.5–15.5)
WBC: 6.6 10*3/uL (ref 4.0–10.5)

## 2017-09-21 LAB — PREPARE FRESH FROZEN PLASMA
Unit division: 0
Unit division: 0

## 2017-09-21 LAB — GLUCOSE, CAPILLARY
GLUCOSE-CAPILLARY: 136 mg/dL — AB (ref 65–99)
Glucose-Capillary: 145 mg/dL — ABNORMAL HIGH (ref 65–99)
Glucose-Capillary: 122 mg/dL — ABNORMAL HIGH (ref 65–99)
Glucose-Capillary: 180 mg/dL — ABNORMAL HIGH (ref 65–99)

## 2017-09-21 MED ORDER — GADOBENATE DIMEGLUMINE 529 MG/ML IV SOLN
18.0000 mL | Freq: Once | INTRAVENOUS | Status: AC | PRN
  Administered 2017-09-21: 18 mL via INTRAVENOUS

## 2017-09-21 NOTE — Progress Notes (Signed)
Palliative Medicine consult noted. Due to high referral volume, there may be a delay seeing this patient. Please call the Palliative Medicine Team office at [phone] if recommendations are needed in the interim.  Thank you for inviting [place] to see this patient.  Marjie Skiff Brentley Horrell, RN, BSN, [facility] 09/21/2017 2:35 PM Office [phone]

## 2017-09-21 NOTE — Significant Event (Signed)
Cell number [phone]

## 2017-09-21 NOTE — Progress Notes (Signed)
Patient ID: [name] [name], male   DOB: 1951-01-28, 67 y.o.   MRN: [id number]    Referring Physician(s): Dr. Wilfrid Lund  Supervising Physician: Markus Daft  Patient Status: [facility] - In-pt  Chief Complaint: Liver lesion/cholestasis  Subjective: Patient states he doesn't feel great this morning, but nonspecific.  He does have some lower abdominal discomfort.  He ate some this am.    Allergies: Patient has no known allergies.  Medications: Prior to Admission medications   Medication Sig Start Date End Date Taking? Authorizing Provider  QUEtiapine (SEROQUEL XR) 400 MG 24 hr tablet Take 400 mg by mouth at bedtime.  09/08/17  Yes [provider]    Vital Signs: BP 139/79 (BP Location: Left Arm)   Pulse (!) 105   Temp 98.1 F (36.7 C) (Oral)   Resp 18   Ht 6' (1.829 m)   Wt 189 lb (85.7 kg)   SpO2 97%   BMI 25.63 kg/m   Physical Exam: Abd: soft, mild lower abdominal tenderness on both sides, nontender in RUQ, no fluid wave noted.    Imaging: Ct Angio Chest Pe W Or Wo Contrast  Result Date: 09/19/2017 CLINICAL DATA:  Shortness of breath. Chest fluttering. Recently diagnosed pancreatic cancer. EXAM: CT ANGIOGRAPHY CHEST WITH CONTRAST TECHNIQUE: Multidetector CT imaging of the chest was performed using the standard protocol during bolus administration of intravenous contrast. Multiplanar CT image reconstructions and MIPs were obtained to evaluate the vascular anatomy. CONTRAST:  157m ISOVUE-370 IOPAMIDOL (ISOVUE-370) INJECTION 76% COMPARISON:  Chest radiographs 05/02/2010. CT abdomen and pelvis 09/14/2017. FINDINGS: Cardiovascular: Pulmonary arterial opacification is suboptimal, and there is also motion artifact which limits assessment (particularly in the left lower lobe). No main or lobar pulmonary artery emboli are identified, and no definite segmental emboli are identified within these limitations. The thoracic aorta is normal in caliber without evidence of aneurysm or  dissection. Mild aortic atherosclerosis is noted. Left circumflex and right coronary artery atherosclerosis is noted. There is no pericardial effusion. Mediastinum/Nodes: Subcentimeter right hilar lymph nodes. No enlarged mediastinal or axillary lymph nodes. Unremarkable thyroid. Nondistended esophagus. Lungs/Pleura: Small left pleural effusion has increased in size from the prior abdominal CT. A small right pleural effusion is new. Evaluation of the lung parenchyma is limited by respiratory motion artifact. Numerous small nodules are present throughout both lungs, many of which were included on the recent abdominal CT. The largest measures 11 x 10 mm in the right middle lobe (series 6, image 69, previously 9 x 7 mm). Chronic scarring is noted in the right lung base. There is subsegmental atelectasis in both lung bases. There is also confluent, predominantly ground-glass opacity posteriorly in the right upper lobe. Upper Abdomen: Partially visualized perihepatic free fluid. Partially visualized liver mass and upper abdominal lymphadenopathy as seen on recent abdominal CT. Musculoskeletal: No suspicious osseous lesion. Review of the MIP images confirms the above findings. IMPRESSION: 1. Motion degraded examination. No central pulmonary emboli identified. 2. Small bilateral pleural effusions. 3. Posterior right upper lobe ground-glass opacity which may reflect pneumonia. 4. Numerous bilateral lung nodules concerning for metastases. 5. Liver mass and upper abdominal lymphadenopathy consistent with metastatic disease, more fully evaluated on recent abdominal CT. 6. Ascites. 7. Aortic Atherosclerosis (ICD10-I70.0). Electronically Signed   By: ALogan BoresM.D.   On: 09/19/2017 18:01   [place]  Result Date: 09/17/2017 CLINICAL DATA:  Acute kidney injury. EXAM: RENAL / URINARY TRACT ULTRASOUND COMPLETE COMPARISON:  CT abdomen pelvis 09/14/2017 FINDINGS: Right Kidney: Length: 11.4  cm. Echogenicity within normal  limits. No mass or hydronephrosis visualized. Left Kidney: Length: 11 cm. Echogenicity within normal limits. No mass or hydronephrosis visualized. Bladder: Appears normal for degree of bladder distention. Prostate gland is enlarged, measuring 4.7 x 3.8 x 5.1 cm. Prostate makes impression on the bladder base. Moderate free fluid in the abdomen consistent with ascites. IMPRESSION: Normal sound appearance of the kidneys. No hydronephrosis. Diffuse ascites. Enlarged prostate gland. Electronically Signed   By: Lucienne Capers M.D.   On: 09/17/2017 21:42   Ir Percutaneous Transhepatic Cholangiogram  Result Date: 09/21/2017 INDICATION: 67 year old with liver lesions and elevated bilirubin level. Elevated bilirubin despite placement of endoscopic stent. Evaluate for biliary dilatation and possible placement of external drain. EXAM: PERCUTANEOUS TRANSHEPATIC CHOLANGIOGRAM WITH ULTRASOUND AND FLUOROSCOPIC GUIDANCE MEDICATIONS: Zosyn 3.375 g; The antibiotic was administered within an appropriate time frame prior to the initiation of the procedure. ANESTHESIA/SEDATION: Moderate (conscious) sedation was employed during this procedure. A total of Versed 1.5 mg and Fentanyl 75 mcg was administered intravenously. Moderate Sedation Time: 60 minutes. The patient's level of consciousness and vital signs were monitored continuously by radiology nursing throughout the procedure under my direct supervision. FLUOROSCOPY TIME:  Fluoroscopy Time: 3 minutes 42 seconds (25 mGy). (fluoroscopy time includes placement of PICC line) CONTRAST:  40 mL [id number] COMPLICATIONS: None immediate. PROCEDURE: Informed written consent was obtained from the patient after a thorough discussion of the procedural risks, benefits and alternatives. All questions were addressed. Maximal Sterile Barrier Technique was utilized including caps, mask, sterile gowns, sterile gloves, sterile drape, hand hygiene and skin antiseptic. A timeout was performed prior to  the initiation of the procedure. The procedure was performed after placement of right arm PICC line and ultrasound-guided paracentesis. Proximal 1 L of fluid was removed from the perihepatic space. Skin was anesthetized with 1% lidocaine. Teague needle was directed into the liver at multiple locations using ultrasound guidance. Using ultrasound guidance, needle was directed into what appeared to be dilated bile ducts in the left hepatic lobe but contrast injections suggested that these were actually portal vein branches. Eventually, non dilated bile duct was cannulated in the right hepatic lobe. Transhepatic cholangiogram was performed. No evidence for biliary obstruction, therefore, biliary drain was not placed. Yueh catheter was directed back in the perihepatic space and some additional ascites was removed. Bandage placed over the puncture site. FINDINGS: Perihepatic ascites. Approximately 1 L of fluid was removed from the perihepatic space and the paracentesis drain was removed during the PTC. During the PTC, the fluid re-accumulated around the liver. Transhepatic cholangiogram demonstrated nondilated intrahepatic bile ducts. However, there is irregularity and narrowing of the central intrahepatic ducts. Contrast filled the endoscopic stent and drained into the duodenum. Ultrasound images demonstrate scattered hepatic lesions including a central hypoechoic lesion. Gallbladder appeared to be mildly dilated containing echogenic material. IMPRESSION: No significant biliary dilatation. Transhepatic cholangiogram demonstrated a patent endoscopic stent. Therefore, an external biliary drain was not placed. Narrowing and irregularity of central intrahepatic bile ducts. Scattered hepatic lesions including a central hypoechoic lesion. Based on these imaging findings and previous CT, findings are highly concerning for a primary biliary or hepatic neoplasm. Differential diagnosis includes cholangiocarcinoma and  gallbladder carcinoma. Mildly dilated structures in left hepatic lobe were thought to represent bile ducts but cannulation confirmed portal venous system. Findings are concerning for underlying thrombus within the left portal venous system and suspect there is thrombus at the portal confluence on the CT from 09/14/2017. Recommend further characterization with MRI  or dedicated liver/pancreatic CT. Immediate re-accumulation of perihepatic ascites following the paracentesis. Electronically Signed   By: Markus Daft M.D.   On: 09/21/2017 08:11   Ir Picc Placement Right >5 Yrs Inc Img Guide  Result Date: 09/21/2017 INDICATION: 67 year old with liver lesions and elevated bilirubin. Plan for percutaneous transhepatic cholangiogram but the patient needs additional IV access for the procedure because he requires FFP and antibiotics along with the sedation. Attempts to place additional peripheral IVs were unsuccessful. EXAM: PICC LINE PLACEMENT WITH ULTRASOUND AND FLUOROSCOPIC GUIDANCE MEDICATIONS: None ANESTHESIA/SEDATION: The patient was continuously monitored during the procedure by the interventional radiology nurse under my direct supervision. FLUOROSCOPY TIME:  Fluoroscopy time was combined between the PICC line placement and the transhepatic cholangiogram. See cholangiogram report for fluoroscopy time. COMPLICATIONS: None immediate. PROCEDURE: The patient was advised of the possible risks and complications and agreed to undergo the procedure. The patient was then brought to the angiographic suite for the procedure. The right arm was prepped with chlorhexidine, draped in the usual sterile fashion using maximum barrier technique (cap and mask, sterile gown, sterile gloves, large sterile sheet, hand hygiene and cutaneous antiseptic). Local anesthesia was attained by infiltration with 1% lidocaine. Ultrasound demonstrated patency of the right brachial vein, and this was documented with an image. Under real-time ultrasound  guidance, this vein was accessed with a 21 gauge micropuncture needle and image documentation was performed. The needle was exchanged over a guidewire for a peel-away sheath through which a 35 cm 5 [place] dual lumen power injectable PICC was advanced, and positioned with its tip at the lower SVC/right atrial junction. Fluoroscopy during the procedure and fluoro spot radiograph confirms appropriate catheter position. The catheter was flushed, secured to the skin, and covered with a sterile dressing. IMPRESSION: Successful placement of a right arm PICC with sonographic and fluoroscopic guidance. The catheter is ready for use. Electronically Signed   By: Markus Daft M.D.   On: 09/21/2017 08:14   Ir Paracentesis  Result Date: 09/21/2017 INDICATION: 67 year old with liver lesions and elevated bilirubin. Patient also has perihepatic ascites. Plan for ultrasound-guided paracentesis and percutaneous transhepatic cholangiogram. EXAM: ULTRASOUND GUIDED PARACENTESIS MEDICATIONS: None. COMPLICATIONS: None immediate. PROCEDURE: Informed written consent was obtained from the patient after a discussion of the risks, benefits and alternatives to treatment. A timeout was performed prior to the initiation of the procedure. Initial ultrasound scanning demonstrates a moderate amount of ascites in the perihepatic space. The right upper abdomen was prepped and draped in the usual sterile fashion. 1% lidocaine with was used for local anesthesia. Following this, a 6 Fr Safe-T-Centesis catheter was introduced. An ultrasound image was saved for documentation purposes. The paracentesis was performed. Catheter was removed and a percutaneous transhepatic cholangiogram was performed. During the cholangiogram, there was re-accumulation of the perihepatic ascites. The Yueh catheter was directed into the perihepatic space and some additional fluid was removed. FINDINGS: A total of approximately 1 L of cloudy yellow/orange fluid was removed.  Samples were sent to the laboratory as requested by the clinical team. IMPRESSION: Successful ultrasound-guided paracentesis yielding 1 liter of peritoneal fluid. Electronically Signed   By: Markus Daft M.D.   On: 09/21/2017 08:18    Labs:  CBC: Recent Labs    09/15/17 0223 09/18/17 0416 09/19/17 0402 09/20/17 0409  WBC 5.3 7.6 6.5 5.4  HGB 10.6* 9.9* 9.9* 10.7*  HCT 30.3* 27.7* 28.2* 30.1*  PLT 141* 129* 162 154    COAGS: Recent Labs    09/14/17 1255 09/19/17  1210 09/20/17 1055  INR 1.35 1.51 1.57    BMP: Recent Labs    09/18/17 0416 09/19/17 0402 09/20/17 0409 09/21/17 0810  NA 131* 131* 132* 133*  K 3.7 4.1 4.4 5.3*  CL 101 103 104 105  CO2 19* 20* 17* 19*  GLUCOSE 149* 166* 162* 167*  BUN '17 19 16 12  '$ CALCIUM 8.9 9.0 8.9 9.1  CREATININE 1.95* 1.33* 0.95 0.83  GFRNONAA 34* 55* >60 >60  GFRAA 40* >60 >60 >60    LIVER FUNCTION TESTS: Recent Labs    09/18/17 0416 09/19/17 0402 09/20/17 0409 09/21/17 0810  BILITOT 21.0* 20.8* 22.1* 22.8*  AST 102* 89* 111* 126*  ALT 54 50 53 57  ALKPHOS 304* 314* 339* 361*  PROT 7.0 7.0 7.4 7.5  ALBUMIN 2.0* 2.0* 2.0* 2.2*    Assessment and Plan: 1. Liver lesions and hyperbilirubinemia  Patient has had a stent placed with persistently elevated TB of 22.  He underwent PTC yesterday with no drain placement given his biliary tree was patent.  In evaluation with doppler on [place] there was a concern some of the areas seen on CT in the liver are actually portal vein, possible from a portal vein occlusion.  Dr. Anselm Pancoast has discussed this case with GI as well as other radiologist and have decided to obtain an MR liver W WO contrast to better evaluate the gallbladder, liver, biliary tree etc (concern for possible gallbladder carcinoma, liver lesions, etc)  We did NOT proceed with a liver bx yesterday as this was too high risk at this point.  He did undergo a paracentesis of about 1L of thick orange colored fluid which has been sent for  cytology.  We will follow his imaging and make further plans as indicated at that time.  Electronically Signed: Henreitta Cea 09/21/2017, 10:59 AM   I spent a total of 25 Minutes at the the patient's bedside AND on the patient's hospital floor or unit, greater than 50% of which was counseling/coordinating care for hyperbilirubinemia, liver lesions

## 2017-09-21 NOTE — Progress Notes (Signed)
[facility] Progress Note  Chief Complaint:    Biliary obstruction  Subjective :   Mild lower abdominal discomfort for last 3-4 days. Some itching   Objective:   Vital signs in last 24 hours: Temp:  [97.6 F (36.4 C)-98.2 F (36.8 C)] 98.1 F (36.7 C) (01/04 0531) Pulse Rate:  [103-110] 105 (01/04 0531) Resp:  [13-25] 18 (01/04 0531) BP: (117-180)/(73-91) 139/79 (01/04 0531) SpO2:  [93 %-100 %] 97 % (01/04 0531) Last BM Date: 09/20/17 General:   Alert,black male in NAD EENT:  Normal hearing, icteric sclera  Heart:  Regular rate and rhythm, no lower extremity edema Pulm: Normal respiratory effort. Abdomen:  Soft, mildly distended,  nontender.  Normal bowel sounds Neurologic:  Alert and  oriented x4;  grossly normal neurologically. Psych:  Pleasant, cooperative.  Normal mood and affect.   Intake/Output from previous day: 01/03 0701 - 01/04 0700 In: 535 [Blood:535] Out: 1740 [Urine:740] Intake/Output this shift: No intake/output data recorded.  Lab Results: Recent Labs    09/19/17 0402 09/20/17 0409  WBC 6.5 5.4  HGB 9.9* 10.7*  HCT 28.2* 30.1*  PLT 162 154   BMET Recent Labs    09/19/17 0402 09/20/17 0409 09/21/17 0810  NA 131* 132* 133*  K 4.1 4.4 5.3*  CL 103 104 105  CO2 20* 17* 19*  GLUCOSE 166* 162* 167*  BUN '19 16 12  '$ CREATININE 1.33* 0.95 0.83  CALCIUM 9.0 8.9 9.1   LFT Recent Labs    09/21/17 0810  PROT 7.5  ALBUMIN 2.2*  AST 126*  ALT 57  ALKPHOS 361*  BILITOT 22.8*   PT/INR Recent Labs    09/19/17 1210 09/20/17 1055  LABPROT 18.1* 18.6*  INR 1.51 1.57   Hepatitis Panel No results for input(s): HEPBSAG, HCVAB, HEPAIGM, HEPBIGM in the last 72 hours.  Ct Angio Chest Pe W Or Wo Contrast  Result Date: 09/19/2017 CLINICAL DATA:  Shortness of breath. Chest fluttering. Recently diagnosed pancreatic cancer. EXAM: CT ANGIOGRAPHY CHEST WITH CONTRAST TECHNIQUE: Multidetector CT imaging of the chest was performed  using the standard protocol during bolus administration of intravenous contrast. Multiplanar CT image reconstructions and MIPs were obtained to evaluate the vascular anatomy. CONTRAST:  1109m ISOVUE-370 IOPAMIDOL (ISOVUE-370) INJECTION 76% COMPARISON:  Chest radiographs 05/02/2010. CT abdomen and pelvis 09/14/2017. FINDINGS: Cardiovascular: Pulmonary arterial opacification is suboptimal, and there is also motion artifact which limits assessment (particularly in the left lower lobe). No main or lobar pulmonary artery emboli are identified, and no definite segmental emboli are identified within these limitations. The thoracic aorta is normal in caliber without evidence of aneurysm or dissection. Mild aortic atherosclerosis is noted. Left circumflex and right coronary artery atherosclerosis is noted. There is no pericardial effusion. Mediastinum/Nodes: Subcentimeter right hilar lymph nodes. No enlarged mediastinal or axillary lymph nodes. Unremarkable thyroid. Nondistended esophagus. Lungs/Pleura: Small left pleural effusion has increased in size from the prior abdominal CT. A small right pleural effusion is new. Evaluation of the lung parenchyma is limited by respiratory motion artifact. Numerous small nodules are present throughout both lungs, many of which were included on the recent abdominal CT. The largest measures 11 x 10 mm in the right middle lobe (series 6, image 69, previously 9 x 7 mm). Chronic scarring is noted in the right lung base. There is subsegmental atelectasis in both lung bases. There is also confluent, predominantly ground-glass opacity posteriorly in the right upper lobe. Upper Abdomen: Partially visualized perihepatic free fluid. Partially visualized liver  mass and upper abdominal lymphadenopathy as seen on recent abdominal CT. Musculoskeletal: No suspicious osseous lesion. Review of the MIP images confirms the above findings. IMPRESSION: 1. Motion degraded examination. No central pulmonary  emboli identified. 2. Small bilateral pleural effusions. 3. Posterior right upper lobe ground-glass opacity which may reflect pneumonia. 4. Numerous bilateral lung nodules concerning for metastases. 5. Liver mass and upper abdominal lymphadenopathy consistent with metastatic disease, more fully evaluated on recent abdominal CT. 6. Ascites. 7. Aortic Atherosclerosis (ICD10-I70.0). Electronically Signed   By: Logan Bores M.D.   On: 09/19/2017 18:01   Ir Percutaneous Transhepatic Cholangiogram  Result Date: 09/21/2017 INDICATION: 67 year old with liver lesions and elevated bilirubin level. Elevated bilirubin despite placement of endoscopic stent. Evaluate for biliary dilatation and possible placement of external drain. EXAM: PERCUTANEOUS TRANSHEPATIC CHOLANGIOGRAM WITH ULTRASOUND AND FLUOROSCOPIC GUIDANCE MEDICATIONS: Zosyn 3.375 g; The antibiotic was administered within an appropriate time frame prior to the initiation of the procedure. ANESTHESIA/SEDATION: Moderate (conscious) sedation was employed during this procedure. A total of Versed 1.5 mg and Fentanyl 75 mcg was administered intravenously. Moderate Sedation Time: 60 minutes. The patient's level of consciousness and vital signs were monitored continuously by radiology nursing throughout the procedure under my direct supervision. FLUOROSCOPY TIME:  Fluoroscopy Time: 3 minutes 42 seconds (25 mGy). (fluoroscopy time includes placement of PICC line) CONTRAST:  40 mL [id number] COMPLICATIONS: None immediate. PROCEDURE: Informed written consent was obtained from the patient after a thorough discussion of the procedural risks, benefits and alternatives. All questions were addressed. Maximal Sterile Barrier Technique was utilized including caps, mask, sterile gowns, sterile gloves, sterile drape, hand hygiene and skin antiseptic. A timeout was performed prior to the initiation of the procedure. The procedure was performed after placement of right arm PICC line and  ultrasound-guided paracentesis. Proximal 1 L of fluid was removed from the perihepatic space. Skin was anesthetized with 1% lidocaine. [place] needle was directed into the liver at multiple locations using ultrasound guidance. Using ultrasound guidance, needle was directed into what appeared to be dilated bile ducts in the left hepatic lobe but contrast injections suggested that these were actually portal vein branches. Eventually, non dilated bile duct was cannulated in the right hepatic lobe. Transhepatic cholangiogram was performed. No evidence for biliary obstruction, therefore, biliary drain was not placed. Yueh catheter was directed back in the perihepatic space and some additional ascites was removed. Bandage placed over the puncture site. FINDINGS: Perihepatic ascites. Approximately 1 L of fluid was removed from the perihepatic space and the paracentesis drain was removed during the PTC. During the PTC, the fluid re-accumulated around the liver. Transhepatic cholangiogram demonstrated nondilated intrahepatic bile ducts. However, there is irregularity and narrowing of the central intrahepatic ducts. Contrast filled the endoscopic stent and drained into the duodenum. Ultrasound images demonstrate scattered hepatic lesions including a central hypoechoic lesion. Gallbladder appeared to be mildly dilated containing echogenic material. IMPRESSION: No significant biliary dilatation. Transhepatic cholangiogram demonstrated a patent endoscopic stent. Therefore, an external biliary drain was not placed. Narrowing and irregularity of central intrahepatic bile ducts. Scattered hepatic lesions including a central hypoechoic lesion. Based on these imaging findings and previous CT, findings are highly concerning for a primary biliary or hepatic neoplasm. Differential diagnosis includes cholangiocarcinoma and gallbladder carcinoma. Mildly dilated structures in left hepatic lobe were thought to represent bile ducts  but cannulation confirmed portal venous system. Findings are concerning for underlying thrombus within the left portal venous system and suspect there is thrombus at the portal confluence  on the CT from 09/14/2017. Recommend further characterization with MRI or dedicated liver/pancreatic CT. Immediate re-accumulation of perihepatic ascites following the paracentesis. Electronically Signed   By: Markus Daft M.D.   On: 09/21/2017 08:11   Ir Picc Placement Right >5 Yrs Inc Img Guide  Result Date: 09/21/2017 INDICATION: 67 year old with liver lesions and elevated bilirubin. Plan for percutaneous transhepatic cholangiogram but the patient needs additional IV access for the procedure because he requires FFP and antibiotics along with the sedation. Attempts to place additional peripheral IVs were unsuccessful. EXAM: PICC LINE PLACEMENT WITH ULTRASOUND AND FLUOROSCOPIC GUIDANCE MEDICATIONS: None ANESTHESIA/SEDATION: The patient was continuously monitored during the procedure by the interventional radiology nurse under my direct supervision. FLUOROSCOPY TIME:  Fluoroscopy time was combined between the PICC line placement and the transhepatic cholangiogram. See cholangiogram report for fluoroscopy time. COMPLICATIONS: None immediate. PROCEDURE: The patient was advised of the possible risks and complications and agreed to undergo the procedure. The patient was then brought to the angiographic suite for the procedure. The right arm was prepped with chlorhexidine, draped in the usual sterile fashion using maximum barrier technique (cap and mask, sterile gown, sterile gloves, large sterile sheet, hand hygiene and cutaneous antiseptic). Local anesthesia was attained by infiltration with 1% lidocaine. Ultrasound demonstrated patency of the right brachial vein, and this was documented with an image. Under real-time ultrasound guidance, this vein was accessed with a 21 gauge micropuncture needle and image documentation was  performed. The needle was exchanged over a guidewire for a peel-away sheath through which a 35 cm 5 [place] dual lumen power injectable PICC was advanced, and positioned with its tip at the lower SVC/right atrial junction. Fluoroscopy during the procedure and fluoro spot radiograph confirms appropriate catheter position. The catheter was flushed, secured to the skin, and covered with a sterile dressing. IMPRESSION: Successful placement of a right arm PICC with sonographic and fluoroscopic guidance. The catheter is ready for use. Electronically Signed   By: Markus Daft M.D.   On: 09/21/2017 08:14   Ir Paracentesis  Result Date: 09/21/2017 INDICATION: 67 year old with liver lesions and elevated bilirubin. Patient also has perihepatic ascites. Plan for ultrasound-guided paracentesis and percutaneous transhepatic cholangiogram. EXAM: ULTRASOUND GUIDED PARACENTESIS MEDICATIONS: None. COMPLICATIONS: None immediate. PROCEDURE: Informed written consent was obtained from the patient after a discussion of the risks, benefits and alternatives to treatment. A timeout was performed prior to the initiation of the procedure. Initial ultrasound scanning demonstrates a moderate amount of ascites in the perihepatic space. The right upper abdomen was prepped and draped in the usual sterile fashion. 1% lidocaine with was used for local anesthesia. Following this, a 6 Fr Safe-T-Centesis catheter was introduced. An ultrasound image was saved for documentation purposes. The paracentesis was performed. Catheter was removed and a percutaneous transhepatic cholangiogram was performed. During the cholangiogram, there was re-accumulation of the perihepatic ascites. The Yueh catheter was directed into the perihepatic space and some additional fluid was removed. FINDINGS: A total of approximately 1 L of cloudy yellow/orange fluid was removed. Samples were sent to the laboratory as requested by the clinical team. IMPRESSION: Successful  ultrasound-guided paracentesis yielding 1 liter of peritoneal fluid. Electronically Signed   By: Markus Daft M.D.   On: 09/21/2017 08:18    ASSESSMENT and PLAN    1. 67 yo male with upper abdominal malignancy of undetermined origin and widely metastatic by imaging. Admitted with biliary obstruction, s/p ERCP 09/15/17 without any significant decline in liver chemistries.  -Until yesterday patient was felt  to most likely have pancreatic cancer but after case discussed with Dr. Anselm Pancoast in IR there is now question of gallbladder cancer instead. Additionally, there is question of PV thrombosis. He underwent liver bx yesterday as well as PTC which showed decompressed but irregular intrahepatic ducts. Contrast flowed through the stent establishing patency.  -await liver mass biopsy results.  -He is scheduled for MRI to better evaluate liver and also assess for PV thrombosis.  2. AKI, resolved.      Principal Problem:   Abdominal pain Active Problems:   DM2 (diabetes mellitus, type 2) ([place])   BIPOLAR DISORDER UNSPECIFIED   Essential hypertension   Obstructive jaundice     LOS: 7 days   Tye Savoy ,NP 09/21/2017, 10:38 AM  Pager number [phone]

## 2017-09-21 NOTE — Progress Notes (Signed)
Physical Therapy Treatment Patient Details Name: [name] [name] MRN: [id number] DOB: 14-May-1951 Today's Date: 09/21/2017    History of Present Illness 67 y.o. male with medical history significant for  DM2, HTN, BPD, who presented to the ED with complaints of dizziness, abdominal pains for the past 2 months. Pt was found to have a bilirubin of 23. CT scan of abdomen revealed a metastatic pancreatic tumor with a large burden of peripancreatic adenopathy and large liver masses.    PT Comments    Today's skilled session focused on improved gait quality and distance. Pt ambulated with RW for 450 ft. Min guard for safety and cueing for technique. This was pt's first time ambulating with RW. He reports that a walker is not practical for his home environment (split level home), but likes the stability. Will trial cane next session. Pt would benefit from continued skilled PT to increase safety with mobility and functional independence. Will continue to follow acutely.    Follow Up Recommendations  Home health PT;Supervision - Intermittent     Equipment Recommendations  (TBD)    Recommendations for Other Services       Precautions / Restrictions Precautions Precautions: Fall Restrictions Weight Bearing Restrictions: No    Mobility  Bed Mobility Overal bed mobility: Modified Independent             General bed mobility comments: increased time and use of rail to come to EOB  Transfers Overall transfer level: Needs assistance Equipment used: None Transfers: Sit to/from Stand Sit to Stand: Min guard         General transfer comment: min guard for safety, increased time and effort.  Ambulation/Gait Ambulation/Gait assistance: Min guard Ambulation Distance (Feet): 450 Feet Assistive device: Rolling walker (2 wheeled)(pushed IV pole w/ both hands) Gait Pattern/deviations: Decreased stride length;Drifts right/left;Narrow base of support Gait velocity: decreased   General Gait  Details: Pt with modest instability when pushing IV pole. Attempted gait with RW. Noted improved quality of gait and stability. Pt able to ambulate farther with RW than previous session and no rest breaks required. Pt winded at end of session. SpO2 at 91% and HR at 123 BPM.   Stairs            Wheelchair Mobility    Modified Rankin (Stroke Patients Only)       Balance Overall balance assessment: Needs assistance   Sitting balance-Leahy Scale: Good     Standing balance support: Single extremity supported Standing balance-Leahy Scale: Fair                              Cognition Arousal/Alertness: Awake/alert Behavior During Therapy: WFL for tasks assessed/performed Overall Cognitive Status: No family/caregiver present to determine baseline cognitive functioning                                        Exercises      General Comments        Pertinent Vitals/Pain Pain Assessment: No/denies pain    Home Living                      Prior Function            PT Goals (current goals can now be found in the care plan section) Acute Rehab PT Goals Patient Stated Goal: to find  out what is going on PT Goal Formulation: With patient Time For Goal Achievement: 10/01/17 Potential to Achieve Goals: Good Progress towards PT goals: Progressing toward goals    Frequency    Min 3X/week      PT Plan Current plan remains appropriate    Co-evaluation              AM-PAC PT "6 Clicks" Daily Activity  Outcome Measure  Difficulty turning over in bed (including adjusting bedclothes, sheets and blankets)?: None Difficulty moving from lying on back to sitting on the side of the bed? : None Difficulty sitting down on and standing up from a chair with arms (e.g., wheelchair, bedside commode, etc,.)?: None Help needed moving to and from a bed to chair (including a wheelchair)?: A Little Help needed walking in hospital room?: A  Little Help needed climbing 3-5 steps with a railing? : A Little 6 Click Score: 21    End of Session Equipment Utilized During Treatment: Gait belt Activity Tolerance: Patient tolerated treatment well Patient left: in chair;with call bell/phone within reach Nurse Communication: Mobility status PT Visit Diagnosis: Unsteadiness on feet (R26.81)     Time: 1010-1029 PT Time Calculation (min) (ACUTE ONLY): 19 min  Charges:  $Gait Training: 8-22 mins                    G Codes:       Benjiman Core, [place] Pager [phone] Acute Rehab   Allena Katz 09/21/2017, 11:18 AM

## 2017-09-21 NOTE — Progress Notes (Signed)
Alert valued called from lab: Total bili 22.8, provider notified.

## 2017-09-22 LAB — HEPATIC FUNCTION PANEL
ALK PHOS: 368 U/L — AB (ref 38–126)
ALT: 54 U/L (ref 17–63)
AST: 118 U/L — ABNORMAL HIGH (ref 15–41)
Albumin: 2 g/dL — ABNORMAL LOW (ref 3.5–5.0)
BILIRUBIN INDIRECT: 9.2 mg/dL — AB (ref 0.3–0.9)
BILIRUBIN TOTAL: 22.7 mg/dL — AB (ref 0.3–1.2)
Bilirubin, Direct: 13.5 mg/dL — ABNORMAL HIGH (ref 0.1–0.5)
TOTAL PROTEIN: 7.1 g/dL (ref 6.5–8.1)

## 2017-09-22 LAB — GLUCOSE, CAPILLARY
GLUCOSE-CAPILLARY: 134 mg/dL — AB (ref 65–99)
Glucose-Capillary: 172 mg/dL — ABNORMAL HIGH (ref 65–99)
Glucose-Capillary: 186 mg/dL — ABNORMAL HIGH (ref 65–99)
Glucose-Capillary: 153 mg/dL — ABNORMAL HIGH (ref 65–99)

## 2017-09-22 NOTE — Progress Notes (Signed)
MRI with a lot of motion degradation.  Prior to determining whether the patient needs any biopsy, we will await on cytology from recent paracentesis.  Will follow.  [name] [name]

## 2017-09-22 NOTE — Progress Notes (Signed)
PROGRESS NOTE  [name] [name] PIR:[id number] DOB: 24-May-1951 DOA: 09/14/2017 PCP: Lucia Gaskins, MD  HPI/Recap of past 24 hours: 67 y.o.male DM2, HTN, BPD, who presented to the ED with complaints of dizziness, such that hewas was unable to get up from his bed + abd pain 2 months duration worsened over the past 2 days with noted jaundice. Pt was found to have a bilirubin of 23. CT scan of abdomen revealed a metastatic pancreatic tumor with a large burden of peripancreatic adenopathy and large liver masses. GI subsequently consulted who performed ERCP which included stent placement in the common bile duct. Oncology on board, to follow up as an outpt. Pt admitted for further management  Today, pt still c/o mild abdominal pain, denies its worsening. Noted to have persistant tachycardia 110s, went up to 130s during PT. Poor appetite. Noted some confusion today. Denies chest pain, SOB, N/V/D, fever/chills.  Assessment/Plan: Principal Problem:   Abdominal pain Active Problems:   DM2 (diabetes mellitus, type 2) ([place])   BIPOLAR DISORDER UNSPECIFIED   Essential hypertension   Obstructive jaundice   Bile obstruction  #Abdominal pain/Obstructive jaundice Transaminitis, T.bili markedly elevated, AST/ALT/ALP rising, CA 19-9 elevated,2985 CT abdomen: metastatic pancreatic tumor with a large burden of peripancreatic adenopathy and large liver masses ERCP performed on 12/29: severeproximalbiliary stricture, proximal choledocholithiasis. Sphincterotomy performed but unable to remove stone due to stricture so plastic stent placed to CBD cytology, non-diagnostic for malignancy Percutaneous liver bx deferred MR liver shows lesion ~ 5 cm adjacent to GB fossa-d/w Dr. Cecile Hearing of GI and there is no role for surgery in his Case-palliative care is appropriate in his case as most remains are palliative-he has probably less than 8 months based on the type of cancer and I will let family know Have asked Hospice  to be involved by CM and offer choice and I plan to d/c him home with hospice 1.6  PV thrombosis-unclear when would be safe and if should AC-will defer to GI  #Persistent tachycardia Stat CT chest done 09/19/17 was neg Continue hydration post CT angio due to CIN  #AKI, probably contrast induced Improving  USS, normal kidney, no hydronephrosis Strict I & O, avoid nephrotoxics Daily BMP  #UTI - S/P IV Rocephin X 5 days - urine cultures showed yeast, no treatment needed except if clinical symptoms changes  #DM2 (diabetes mellitus, type 2) - SSI, sugars 134-172 and controlled - Carb modified diet  #Bipolar - seroquel 400 mg hs  #Essential hypertension - stable off antihypertensives     Code Status: Full  Family Communication: None at bedside  Disposition Plan: Home once work-up complete   Consultants:  GI  Oncology  IR  Procedures:  ERCP on 12/29  Antimicrobials:  IV Ceftriaxone  DVT prophylaxis:  SCDs for now due to upcoming biopsy   Objective: Vitals:   09/21/17 1625 09/21/17 2220 09/21/17 2220 09/22/17 0520  BP: 136/79 139/87 139/87 (!) 129/49  Pulse: (!) 107 (!) 106 (!) 106 (!) 111  Resp: 20 19 18 18   Temp: 98.2 F (36.8 C) 98.2 F (36.8 C) 98.2 F (36.8 C) 98.1 F (36.7 C)  TempSrc: Oral Oral Oral Oral  SpO2: 96% 94% 94% 96%  Weight:      Height:        Intake/Output Summary (Last 24 hours) at 09/22/2017 1357 Last data filed at 09/22/2017 1022 Gross per 24 hour  Intake 6511.67 ml  Output -  Net 6511.67 ml   Filed Weights   09/14/17  2035 09/14/17 2224 09/15/17 0849  Weight: 81.6 kg (180 lb) 85.8 kg (189 lb 2.5 oz) 85.7 kg (189 lb)    Exam:  Weak alert pleasant in nad Wants to eat  no cp cta b abd soft nt nd no rebound no guard No tremor nor asterixis  Data Reviewed: CBC: Recent Labs  Lab 09/18/17 0416 09/19/17 0402 09/20/17 0409 09/21/17 0810  WBC 7.6 6.5 5.4 6.6  NEUTROABS  --  4.2 3.6  --   HGB 9.9* 9.9* 10.7*  9.8*  HCT 27.7* 28.2* 30.1* 29.0*  MCV 83.2 83.4 83.1 84.1  PLT 129* 162 154 790   Basic Metabolic Panel: Recent Labs  Lab 09/17/17 0223 09/18/17 0416 09/19/17 0402 09/20/17 0409 09/21/17 0810  NA 131* 131* 131* 132* 133*  K 3.8 3.7 4.1 4.4 5.3*  CL 98* 101 103 104 105  CO2 23 19* 20* 17* 19*  GLUCOSE 168* 149* 166* 162* 167*  BUN 19 17 19 16 12   CREATININE 2.47* 1.95* 1.33* 0.95 0.83  CALCIUM 8.7* 8.9 9.0 8.9 9.1   GFR: Estimated Creatinine Clearance: 97.4 mL/min (by C-G formula based on SCr of 0.83 mg/dL). Liver Function Tests: Recent Labs  Lab 09/18/17 0416 09/19/17 0402 09/20/17 0409 09/21/17 0810 09/22/17 0316  AST 102* 89* 111* 126* 118*  ALT 54 50 53 57 54  ALKPHOS 304* 314* 339* 361* 368*  BILITOT 21.0* 20.8* 22.1* 22.8* 22.7*  PROT 7.0 7.0 7.4 7.5 7.1  ALBUMIN 2.0* 2.0* 2.0* 2.2* 2.0*   Recent Labs  Lab 09/16/17 0343  LIPASE 45   Recent Labs  Lab 09/19/17 1416 09/20/17 0409  AMMONIA 48* 77*   Coagulation Profile: Recent Labs  Lab 09/19/17 1210 09/20/17 1055  INR 1.51 1.57   Cardiac Enzymes: No results for input(s): CKTOTAL, CKMB, CKMBINDEX, TROPONINI in the last 168 hours. BNP (last 3 results) No results for input(s): PROBNP in the last 8760 hours. HbA1C: No results for input(s): HGBA1C in the last 72 hours. CBG: Recent Labs  Lab 09/21/17 1233 09/21/17 1727 09/21/17 2219 09/22/17 0745 09/22/17 1218  GLUCAP 136* 122* 180* 134* 172*   Lipid Profile: No results for input(s): CHOL, HDL, LDLCALC, TRIG, CHOLHDL, LDLDIRECT in the last 72 hours. Thyroid Function Tests: No results for input(s): TSH, T4TOTAL, FREET4, T3FREE, THYROIDAB in the last 72 hours. Anemia Panel: No results for input(s): VITAMINB12, FOLATE, FERRITIN, TIBC, IRON, RETICCTPCT in the last 72 hours. Urine analysis:    Component Value Date/Time   COLORURINE AMBER (A) 09/14/2017 1256   APPEARANCEUR CLOUDY (A) 09/14/2017 1256   LABSPEC 1.020 09/14/2017 1256   PHURINE  5.0 09/14/2017 1256   GLUCOSEU 50 (A) 09/14/2017 1256   HGBUR NEGATIVE 09/14/2017 1256   BILIRUBINUR MODERATE (A) 09/14/2017 1256   KETONESUR NEGATIVE 09/14/2017 1256   PROTEINUR 30 (A) 09/14/2017 1256   UROBILINOGEN 1.0 02/07/2013 1814   NITRITE NEGATIVE 09/14/2017 1256   LEUKOCYTESUR NEGATIVE 09/14/2017 1256   Sepsis Labs: @LABRCNTIP (procalcitonin:4,lacticidven:4)  ) Recent Results (from the past 240 hour(s))  Culture, blood (Routine x 2)     Status: None   Collection Time: 09/14/17 12:55 PM  Result Value Ref Range Status   Specimen Description   Final    BLOOD RIGHT HAND BOTTLES DRAWN AEROBIC AND ANAEROBIC   Special Requests Blood Culture adequate volume  Final   Culture NO GROWTH 5 DAYS  Final   Report Status 09/19/2017 FINAL  Final  Culture, blood (Routine x 2)     Status: None  Collection Time: 09/14/17 12:55 PM  Result Value Ref Range Status   Specimen Description   Final    LEFT ANTECUBITAL BOTTLES DRAWN AEROBIC AND ANAEROBIC   Special Requests   Final    Blood Culture results may not be optimal due to an excessive volume of blood received in culture bottles   Culture NO GROWTH 5 DAYS  Final   Report Status 09/19/2017 FINAL  Final  Culture, Urine     Status: Abnormal   Collection Time: 09/18/17  4:46 AM  Result Value Ref Range Status   Specimen Description URINE, CLEAN CATCH  Final   Special Requests NONE  Final   Culture 80,000 COLONIES/mL YEAST HYPHAL ELEMENTS SEEN (A)  Final   Report Status 09/19/2017 FINAL  Final      Studies: Mr Liver W Wo Contrast  Result Date: 09/22/2017 CLINICAL DATA:  Portal vein thrombosis. Liver masses and multiple nodes on prior CT. EXAM: MRI ABDOMEN WITHOUT AND WITH CONTRAST TECHNIQUE: Multiplanar multisequence MR imaging of the abdomen was performed both before and after the administration of intravenous contrast. CONTRAST:  16mL MULTIHANCE GADOBENATE DIMEGLUMINE 529 MG/ML IV SOLN COMPARISON:  CT abdomen/ pelvis dated 09/06/2017.  FINDINGS: Severely motion degraded images. Lower chest: Small bilateral pleural effusions, left greater than right. Hepatobiliary: Infiltrating hypoenhancing tumor in the central liver (segment 4B), adjacent to the gallbladder fossa, measuring approximately 5.4 x 3.3 cm (series 1201/image 52). This is poorly evaluated due to motion degradation. Primary differential considerations include intrahepatic cholangiocarcinoma versus gallbladder adenocarcinoma, although metastases remain possible. Hepatocellular carcinoma is also technically possible but considered less likely. Associated regional intrahepatic ductal dilatation primarily involving the left hepatic lobe (series 7/ image 19). Gallbladder is notable for layering gallstones with central proteinaceous/ hemorrhagic fluid in the gallbladder lumen (notable for intrinsic T1 hyperintensity. Additional scattered hepatic metastases, including a dominant 2.2 cm lesion in segment 6 (series 1201/ image 47). These are poorly evaluated due to motion degradation and are better visualized on prior CT. Pancreas: Pancreas is grossly unremarkable. No definite mass, pancreatic ductal dilatation, or parenchymal atrophy. Spleen:  Within normal limits. Adrenals/Urinary Tract:  Adrenal glands within normal limits. Kidneys within normal limits.  No hydronephrosis. Stomach/Bowel: Stomach is mildly distended. Visualized bowel is poorly evaluated but grossly unremarkable. Vascular/Lymphatic:  No evidence of abdominal aortic aneurysm. Central portal vein thrombosis, although poorly evaluated due to motion degradation. Upper abdominal/peripancreatic nodes measuring up to 11 mm short axis (series 1202/ image 73), better evaluated on recent CT. Other:  Mild upper abdominal ascites. Musculoskeletal: No focal osseous lesions. IMPRESSION: Severely motion degraded images, limiting evaluation. Dominant 5.4 cm lesion in the central liver, adjacent to the gallbladder fossa. Primary differential  considerations include intrahepatic or gallbladder adenocarcinoma, with metastases less likely. Additional scattered hepatic metastases, measuring up to 2.1 cm in segment 6, better evaluated on prior CT. Associated portal vein thrombosis, poorly evaluated. Small upper abdominal lymph nodes measuring up to 11 mm short axis, poorly evaluated. Small volume perihepatic ascites. Small bilateral pleural effusions, left greater than right. Electronically Signed   By: Julian Hy M.D.   On: 09/22/2017 09:17    Scheduled Meds: . feeding supplement (GLUCERNA SHAKE)  237 mL Oral BID BM  . Influenza vac split quadrivalent PF  0.5 mL Intramuscular Tomorrow-1000  . insulin aspart  0-9 Units Subcutaneous TID WC  . lactulose  20 g Oral TID  . pneumococcal 23 valent vaccine  0.5 mL Intramuscular Tomorrow-1000  . polyethylene glycol  17 g Oral Daily  .  QUEtiapine  400 mg Oral QHS  . senna-docusate  1 tablet Oral BID    Continuous Infusions: . sodium chloride 100 mL/hr at 09/22/17 1249  . sodium chloride       LOS: 8 days     Nita Sells, MD [facility]   If 7PM-7AM, please contact night-coverage [email or web address] Password [email or web address] 09/22/2017, 1:57 PM

## 2017-09-23 DIAGNOSIS — Z515 Encounter for palliative care: Secondary | ICD-10-CM

## 2017-09-23 DIAGNOSIS — Z7189 Other specified counseling: Secondary | ICD-10-CM

## 2017-09-23 DIAGNOSIS — R1900 Intra-abdominal and pelvic swelling, mass and lump, unspecified site: Secondary | ICD-10-CM

## 2017-09-23 DIAGNOSIS — K7200 Acute and subacute hepatic failure without coma: Secondary | ICD-10-CM

## 2017-09-23 LAB — COMPREHENSIVE METABOLIC PANEL
ALK PHOS: 344 U/L — AB (ref 38–126)
ALT: 55 U/L (ref 17–63)
AST: 115 U/L — ABNORMAL HIGH (ref 15–41)
Albumin: 1.9 g/dL — ABNORMAL LOW (ref 3.5–5.0)
Anion gap: 6 (ref 5–15)
BILIRUBIN TOTAL: 22.7 mg/dL — AB (ref 0.3–1.2)
BUN: 8 mg/dL (ref 6–20)
CO2: 20 mmol/L — AB (ref 22–32)
CREATININE: 0.9 mg/dL (ref 0.61–1.24)
Calcium: 9 mg/dL (ref 8.9–10.3)
Chloride: 110 mmol/L (ref 101–111)
Glucose, Bld: 165 mg/dL — ABNORMAL HIGH (ref 65–99)
Potassium: 3.4 mmol/L — ABNORMAL LOW (ref 3.5–5.1)
Sodium: 136 mmol/L (ref 135–145)
Total Protein: 6.7 g/dL (ref 6.5–8.1)

## 2017-09-23 LAB — GLUCOSE, CAPILLARY
GLUCOSE-CAPILLARY: 181 mg/dL — AB (ref 65–99)
Glucose-Capillary: 138 mg/dL — ABNORMAL HIGH (ref 65–99)

## 2017-09-23 MED ORDER — OXYCODONE HCL 5 MG PO TABS: 5.0000 mg | ORAL_TABLET | ORAL | Status: DC | PRN

## 2017-09-23 MED ORDER — LORAZEPAM 0.5 MG PO TABS: 0.5000 mg | ORAL_TABLET | Freq: Four times a day (QID) | ORAL | Status: DC | PRN

## 2017-09-23 MED ORDER — OXYCODONE-ACETAMINOPHEN 5-325 MG PO TABS: 1.0000 | ORAL_TABLET | Freq: Four times a day (QID) | ORAL | 0 refills | Status: DC | PRN

## 2017-09-23 MED ORDER — LACTULOSE 10 GM/15ML PO SOLN: 20.0000 g | Freq: Every day | ORAL | 0 refills | Status: DC

## 2017-09-23 NOTE — Care Management Note (Signed)
Case Management Note  Patient Details  Name: [name] [name] MRN: [id number] Date of Birth: [birth year]  Subjective/Objective:  Acceptance of this pt by Leslie confirmed by phone. Will fax Copy of facesheet, H and P, and DCS to [phone]                  Action/Plan:CM will sign off for now but will be available should additional discharge needs arise or disposition change.    Expected Discharge Date:  09/23/17               Expected Discharge Plan:  [place]  In-House Referral:  Hospice / Palliative Care  Discharge planning Services  CM Consult  Post Acute Care Choice:  Hospice Choice offered to:  Patient  DME Arranged:    DME Agency:     HH Arranged:    [place]:  [facility]  Status of Service:  Completed, signed off  If discussed at [employer], dates discussed:    Additional Comments:  Delrae Sawyers, RN 09/23/2017, 11:22 AM

## 2017-09-23 NOTE — Care Management Note (Signed)
Case Management Note  Patient Details  Name: GIOVONI BUNCH MRN: [id number] Date of Birth: 1950-10-28  Subjective/Objective:   67 yo with diagnosis of Pancreatic Cancer. MD has consulted Palliative Care and also would like Hospice at Home. Will talk with Verlon Au, MD to see if he would like me to wait on Palliative med prior to consult with family.                  Action/Plan:CM will follow closely for disposition/discharge needs.    Expected Discharge Date:                  Expected Discharge Plan:  [place]  In-House Referral:  Hospice / Palliative Care  Discharge planning Services  CM Consult  Post Acute Care Choice:  Home Health Choice offered to:     DME Arranged:    DME Agency:     HH Arranged:    HH Agency:     Status of Service:  In process, will continue to follow  If discussed at Long Length of Stay Meetings, dates discussed:    Additional Comments:  Delrae Sawyers, RN 09/23/2017, 9:55 AM

## 2017-09-23 NOTE — ACP (Advance Care Planning) (Signed)
Palliative Medicine- Advance Care Planning Note:   Discussed advanced health care directives- he notes that he would prefer his sisters to be his decision makers if he were not able to decide for himself. I introduced the need for [place] documentation due to the fact that by law, his children would be the decision makers. He said, it would be ok with him if his children were his decision makers- they would discuss with his sisters.  Discussed code status- he would prefer not to be resuscitated if he experienced natural death given his advanced cancer with poor prognosis. He requests DNR code status.  Mariana Kaufman, AGNP-C Palliative Medicine  Please call Palliative Medicine team phone with any questions [phone]. For individual providers please see AMION.

## 2017-09-23 NOTE — Progress Notes (Signed)
[name] [name] to be D/C'd Home with Hospice Care per MD order.  Discussed with the patient and all questions fully answered.  VSS, Skin clean, dry and intact without evidence of skin break down, no evidence of skin tears noted. IV catheter discontinued intact. Site without signs and symptoms of complications. Dressing and pressure applied.  An After Visit Summary was printed and given to the patient. Patient received prescription.  D/c education completed with patient/family including follow up instructions, medication list, d/c activities limitations if indicated, with other d/c instructions as indicated by MD - patient able to verbalize understanding, all questions fully answered.   Patient instructed to return to ED, call 911, or call MD for any changes in condition.   Patient escorted via [place], and D/C home via private auto.  Richardean Chimera 09/23/2017 1:37 PM

## 2017-09-23 NOTE — Consult Note (Signed)
Consultation Note Date: 09/23/2017   Patient Name: [name] [name]  DOB: [birth year]  MRN: [id number]  Age / Sex: 67 y.o., male  PCP: Lucia Gaskins, MD Referring Physician: Nita Sells, MD  Reason for Consultation: Establishing goals of care, Hospice Evaluation and Terminal Care  HPI/Patient Profile: 67 y.o. male  with past medical history of DM2, HTN, bipolar d/o,  admitted on 09/14/2017 with dizziness, abdominal pain, and jaundice. Workup has revealed liver mass with biliary obstruction (had biliary stent placed without improvement in liver function), GI lymphadenopathy (likely metastasis), lung nodules, abdominal ascites (pt had paracentesis with 1L fluid removed- fluid sent for path, not resulted to date), CA19-9 elevated 2,985, albumin 1.9-  Clinical and imaging findings highly suspicious for primary hepatic or GI malignancy. Per discussion with attending physician patient and family decided on transition to comfort care and referral to Hospice services. Palliative medicine consulted for further [place] conversation and EOL care assistance.    Clinical Assessment and Goals of Care: Met with patient at bedside. He prefers to be called "[name]".  Discussed patient's diagnosis and illness trajectory. He tells me he has discussed his diagnosis with his children and his sisters who are the main providers of support for him. He is ambulatory, po intake is decreased. He has had significant weight loss over the last several months- about 40 pounds.  Tells me his mother died from pancreatic cancer and it was very rough for her. His GOC are to "go as easy and painless as possible". We discussed Hospice philosophy and services they provide. He understands that means no further testing for his cancer, or aggressive medical treatment.  He is having some lower abdominal pain. He did not realize he could ask nursing for pain  medicine. We discussed the importance of keeping his bowels regulated with taking pain medications.  He is having anxiety related to his diagnosis. His first night here he says he had "heart flutters" and feels this was related to anxiety. Anxiety is also keeping him awake at night. Discussed using lorazepam prn for anxiety.  Discussed advanced health care directives- he notes that he would prefer his sisters to be his decision makers if he were not able to decide for himself. I introduced the need for [place] documentation due to the fact that by law, his children would be the decision makers. He said, it would be ok with him if his children were his decision makers- they would discuss with his sisters.  Discussed code status- he would prefer not to be resuscitated if he experienced natural death given his advanced cancer with poor prognosis. He requests DNR code status.  Primary Decision Maker PATIENT    SUMMARY OF RECOMMENDATIONS -DNR- yellow form placed on chart -Lorazepam .66m po q6hr prn for anxiety or sleep -Would recommend transition from [place] oxycodone IR po q4hr prn for pain control due to liver dysfunction- he will likely benefit from long acting pain medication as his cancer progresses -Recommend continuing home medications as was  previously on as patient is not actively dying  Code Status/Advance Care Planning:  DNR  Palliative Prophylaxis:   Frequent Pain Assessment  Prognosis:    < 6 months  Discharge Planning: Home with Hospice  Primary Diagnoses: Present on Admission: . Abdominal pain . BIPOLAR DISORDER UNSPECIFIED . Essential hypertension . Obstructive jaundice   I have reviewed the medical record, interviewed the patient and family, and examined the patient. The following aspects are pertinent.  Past Medical History:  Diagnosis Date  . Bipolar 1 disorder ([place])   . Blood transfusion   . Stroke [facility])    Social History    Socioeconomic History  . Marital status: Divorced    Spouse name: None  . Number of children: None  . Years of education: None  . Highest education level: None  Social Needs  . Financial resource strain: None  . Food insecurity - worry: None  . Food insecurity - inability: None  . Transportation needs - medical: None  . Transportation needs - non-medical: None  Occupational History  . None  Tobacco Use  . Smoking status: Former Smoker    Packs/day: 1.00    Years: 20.00    Pack years: 20.00    Types: Cigarettes    Last attempt to quit: 09/19/2003    Years since quitting: 14.0  . Smokeless tobacco: Never Used  Substance and Sexual Activity  . Alcohol use: No    Comment: former  . Drug use: No  . Sexual activity: None  Other Topics Concern  . None  Social History Narrative  . None   History reviewed. No pertinent family history. Scheduled Meds: . feeding supplement (GLUCERNA SHAKE)  237 mL Oral BID BM  . Influenza vac split quadrivalent PF  0.5 mL Intramuscular Tomorrow-1000  . insulin aspart  0-9 Units Subcutaneous TID WC  . lactulose  20 g Oral TID  . pneumococcal 23 valent vaccine  0.5 mL Intramuscular Tomorrow-1000  . polyethylene glycol  17 g Oral Daily  . QUEtiapine  400 mg Oral QHS  . senna-docusate  1 tablet Oral BID   Continuous Infusions: . sodium chloride 50 mL/hr at 09/23/17 0431   PRN Meds:.HYDROcodone-acetaminophen, LORazepam Medications Prior to Admission:  Prior to Admission medications   Medication Sig Start Date End Date Taking? Authorizing Provider  QUEtiapine (SEROQUEL XR) 400 MG 24 hr tablet Take 400 mg by mouth at bedtime.  09/08/17  Yes [provider]  lactulose (CHRONULAC) 10 GM/15ML solution Take 30 mLs (20 g total) by mouth daily. 09/23/17   Nita Sells, MD  oxyCODONE-acetaminophen (ROXICET) 5-325 MG tablet Take 1 tablet by mouth every 6 (six) hours as needed. 09/23/17 09/23/18  Nita Sells, MD   No Known  Allergies Review of Systems  Constitutional: Positive for activity change, appetite change and fatigue.  Eyes: Positive for visual disturbance.  Gastrointestinal: Positive for abdominal distention.  Genitourinary: Positive for urgency.  Psychiatric/Behavioral: The patient is nervous/anxious.     Physical Exam  Constitutional: He is oriented to person, place, and time. No distress.  Eyes: Scleral icterus is present.  Cardiovascular: Normal rate and regular rhythm.  Pulmonary/Chest: Effort normal and breath sounds normal.  Abdominal: He exhibits distension.  Neurological: He is alert and oriented to person, place, and time.  Skin: Skin is warm and dry. He is not diaphoretic.  Nursing note and vitals reviewed.   Vital Signs: BP 130/71 (BP Location: Left Arm)   Pulse (!) 117   Temp 98.5 F (36.9  C) (Oral)   Resp 16   Ht 6' (1.829 m)   Wt 85.7 kg (189 lb)   SpO2 94%   BMI 25.63 kg/m  Pain Assessment: 0-10   Pain Score: 6    SpO2: SpO2: 94 % O2 Device:SpO2: 94 % O2 Flow Rate: .O2 Flow Rate (L/min): 2 L/min  IO: Intake/output summary:   Intake/Output Summary (Last 24 hours) at 09/23/2017 1200 Last data filed at 09/23/2017 0741 Gross per 24 hour  Intake 1472.5 ml  Output 250 ml  Net 1222.5 ml    LBM: Last BM Date: 09/23/17 Baseline Weight: Weight: 81.6 kg (180 lb) Most recent weight: Weight: 85.7 kg (189 lb)     Palliative Assessment/Data: PPS: 50%     Thank you for this consult. Palliative medicine will continue to follow and assist as needed.   Time In: 1045 Time Out: 1230 Time Total: 105 minutes Greater than 50%  of this time was spent counseling and coordinating care related to the above assessment and plan.  Signed by: Mariana Kaufman, AGNP-C Palliative Medicine    Please contact Palliative Medicine Team phone at [phone] for questions and concerns.  For individual provider: See Shea Evans

## 2017-09-23 NOTE — Care Management Note (Signed)
Case Management Note  Patient Details  Name: [name] [name] MRN: [id number] Date of Birth: 1951-01-23  Subjective/Objective: 67 y.o. Admitted with Pancreatic Cancer to be discharged Home with Hospice. Pt will be seen by Palliative this am.   pt has chosen [facility] to provide care at discharge. No immediate DME needs as he has had HH with [facility] in the past and tells me he has all the DME he needs.  Lives alone, sister Huston Foley close by to assist. CM placed call to [facility] for referral [phone] and awaiting return call for confirmation.                    Action/Plan:    Expected Discharge Date:  09/23/17               Expected Discharge Plan:  [place]  In-House Referral:  Hospice / Palliative Care  Discharge planning Services  CM Consult  Post Acute Care Choice:  Hospice Choice offered to:  Patient  DME Arranged:    DME Agency:     HH Arranged:    [place] Agency:     Status of Service:  Completed, signed off  If discussed at [employer], dates discussed:    Additional Comments:  Delrae Sawyers, RN 09/23/2017, 10:47 AM

## 2017-09-23 NOTE — Discharge Summary (Addendum)
Physician Discharge Summary  [name] [name] OZH:[id number] DOB: 01-12-1951 DOA: 09/14/2017  PCP: Lucia Gaskins, MD  Admit date: 09/14/2017 Discharge date: 09/23/2017  Time spent: 35 minutes  Recommendations for Outpatient Follow-up:  1. Will nedd [facility] to follow up for EOL care as OP  Discharge Diagnoses:  Principal Problem:   Abdominal pain Active Problems:   DM2 (diabetes mellitus, type 2) ([place])   BIPOLAR DISORDER UNSPECIFIED   Essential hypertension   Obstructive jaundice   Bile obstruction   Discharge Condition: poor--6wek to 2 mo  Diet recommendation: liberalize diet  Filed Weights   09/14/17 2035 09/14/17 2224 09/15/17 0849  Weight: 81.6 kg (180 lb) 85.8 kg (189 lb 2.5 oz) 85.7 kg (189 lb)    History of present illness:  67 y.o.male DM2, HTN, BPD, who presented to the ED with complaints of dizziness, such that hewas was unable to get up from his bed + abd pain 2 months duration worsened over the past 2 days with noted jaundice. Pt was found to have a bilirubin of 23. CT scan of abdomen revealed a large burden of peripancreatic adenopathy and large liver masses. GI subsequently consulted who performed ERCP which included stent placement in the common bile duct. Oncology saw, IR saw But patient declined use of any Chemo and wouldn't have wanted aggressive care    Hospital Course:  #Abdominal pain/Obstructive jaundice Transaminitis, T.bili markedly elevated, AST/ALT/ALP rising, CA 19-9 elevated,2985 CT abdomen: metastatic pancreatic tumor with a large burden of peripancreatic adenopathy and large liver masses ERCP performed on 12/29: severeproximalbiliary stricture, proximal choledocholithiasis. Sphincterotomy performed but unable to remove stone due to stricture so plastic stent placed to CBD cytology, non-diagnostic for malignancy Percutaneous liver bx deferred MR liver shows lesion ~ 5 cm adjacent to GB fossa-d/w Dr. 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 and if should AC--poor overall prognosis and will d/c home with [facility]  #Persistent tachycardia Stat CT chest done 09/19/17 was neg Continue hydration post CT angio due to CIN  #AKI, probably contrast induced Improving  USS, normal kidney, no hydronephrosis stabilized on d/c  #UTI - S/P IV Rocephin X 5 days - urine cultures showed yeast, no treatment needed except if clinical symptoms changes  #DM2 (diabetes mellitus, type 2) - SSI, sugars 134-172 and controlled Liberalize diet on d/c  #Bipolar - seroquel 400 mg hs  #Essential hypertension - stable off antihypertensives   Consultations:  GI   IR  Discharge Exam: Vitals:   09/22/17 2118 09/23/17 0555  BP: 139/75 130/71  Pulse: (!) 114 (!) 117  Resp: 16 16  Temp: 98.8 F (37.1 C) 98.5 F (36.9 C)  SpO2: 96% 94%    General: awake alert pleasant icteric in nad Cardiovascular: s1 s 2no m/r/g Respiratory: clear No adde sound Abd soft nt nd no masses  Discharge Instructions   Discharge Instructions    Diet - low sodium heart healthy   Complete by:  As directed    Discharge instructions   Complete by:  As directed    We will get [facility] involved as an Outpatient with regards to end of life care.  We will also prescribe medications that are in keeping with the hospice philosophy- as such no strict control of  sugar or blood pressure or other issues Recommend keeping a small amount of lactulose to allow for daily stool and to prevent confusion and seroquel.   Increase activity slowly   Complete by:  As directed      Allergies as of 09/23/2017   No Known Allergies     Medication List    TAKE these medications   lactulose 10 GM/15ML solution Commonly  known as:  CHRONULAC Take 30 mLs (20 g total) by mouth daily.   QUEtiapine 400 MG 24 hr tablet Commonly known as:  SEROQUEL XR Take 400 mg by mouth at bedtime.      No Known Allergies    The results of significant diagnostics from this hospitalization (including imaging, microbiology, ancillary and laboratory) are listed below for reference.    Significant Diagnostic Studies: Ct Angio Chest Pe W Or Wo Contrast  Result Date: 09/19/2017 CLINICAL DATA:  Shortness of breath. Chest fluttering. Recently diagnosed pancreatic cancer. EXAM: CT ANGIOGRAPHY CHEST WITH CONTRAST TECHNIQUE: Multidetector CT imaging of the chest was performed using the standard protocol during bolus administration of intravenous contrast. Multiplanar CT image reconstructions and MIPs were obtained to evaluate the vascular anatomy. CONTRAST:  176m ISOVUE-370 IOPAMIDOL (ISOVUE-370) INJECTION 76% COMPARISON:  Chest radiographs 05/02/2010. CT abdomen and pelvis 09/14/2017. FINDINGS: Cardiovascular: Pulmonary arterial opacification is suboptimal, and there is also motion artifact which limits assessment (particularly in the left lower lobe). No main or lobar pulmonary artery emboli are identified, and no definite segmental emboli are identified within these limitations. The thoracic aorta is normal in caliber without evidence of aneurysm or dissection. Mild aortic atherosclerosis is noted. Left circumflex and right coronary artery atherosclerosis is noted. There is no pericardial effusion. Mediastinum/Nodes: Subcentimeter right hilar lymph nodes. No enlarged mediastinal or axillary lymph nodes. Unremarkable thyroid. Nondistended esophagus. Lungs/Pleura: Small left pleural effusion has increased in size from the prior abdominal CT. A small right pleural effusion is new. Evaluation of the lung parenchyma is limited by respiratory motion artifact. Numerous small nodules are present throughout both lungs, many of which were included on  the recent abdominal CT. The largest measures 11 x 10 mm in the right middle lobe (series 6, image 69, previously 9 x 7 mm). Chronic scarring is noted in the right lung base. There is subsegmental atelectasis in both lung bases. There is also confluent, predominantly ground-glass opacity posteriorly in the right upper lobe. Upper Abdomen: Partially visualized perihepatic free fluid. Partially visualized liver mass and upper abdominal lymphadenopathy as seen on recent abdominal CT. Musculoskeletal: No suspicious osseous lesion. Review of the MIP images confirms the above findings. IMPRESSION: 1. Motion degraded examination. No central pulmonary emboli identified. 2. Small bilateral pleural effusions. 3. Posterior right upper lobe ground-glass opacity which may reflect pneumonia. 4. Numerous bilateral lung nodules concerning for metastases. 5. Liver mass and upper abdominal lymphadenopathy consistent with metastatic disease, more fully evaluated on recent abdominal CT. 6. Ascites. 7. Aortic Atherosclerosis (ICD10-I70.0). Electronically Signed   By: ALogan BoresM.D.   On: 09/19/2017 18:01   Ct Abdomen Pelvis W Contrast  Result Date: 09/14/2017 CLINICAL DATA:  Hematuria for 2 weeks. EXAM: CT ABDOMEN AND PELVIS WITH CONTRAST TECHNIQUE: Multidetector CT imaging of the abdomen and pelvis was performed using the standard protocol following bolus administration of intravenous contrast. CONTRAST:  1065mISOVUE-300 IOPAMIDOL (ISOVUE-300) INJECTION 61% COMPARISON:  None. FINDINGS: Lower chest: Multiple nodules are identified in bilateral lung bases largest in the lateral left lung base measuring 7 mm. Scarring of  right lung base is identified. The heart size is normal. Hepatobiliary: Multiple heterogeneously enhancing masses are identified within the liver. There is intrahepatic biliary ductal dilatation. No gallstone is identified. Pancreas: There multiple abnormal enlarged firm enhancing lymph nodes along the superior  mesenteric and celiac axis. These abnormal lymph nodes are inseparable from the posterior aspect of the head the pancreas particularly on image 38 series 2. Spleen: Normal in size without focal abnormality. Adrenals/Urinary Tract: The adrenal glands are normal. There are small simple cysts in both kidneys. There is no hydronephrosis bilaterally. The bladder is partially decompressed without gross abnormality. Stomach/Bowel: There is a thick walled stomach particularly in the mid to distal portion extending into the duodenum. There is no small bowel obstruction or diverticulitis. Vascular/Lymphatic: Enlarged abnormal lymph nodes are identified along the celiac and superior mesenteric axis. There also smaller lymph nodes in the right lower quadrant and along the periaortic region. Atherosclerosis of the abdominal aorta is identified without aneurysmal dilatation. Reproductive: Prostate enlargement is noted. Other: Small ascites is identified in the abdomen and pelvis. Musculoskeletal: Degenerative joint changes of the spine are identified. IMPRESSION: Findings consistent with metastasis in the liver and in the lungs. Abnormal metastatic lymph nodes are also identified in the abdomen. There is thick wall stomach particularly in the mid to distal portion extending into the duodenum, neoplastic involvement is not excluded. There is also inseparable abnormal lymph nodes from the posterior aspect of had the pancreas, neoplastic involvement is not excluded. Electronically Signed   By: Abelardo Diesel M.D.   On: 09/14/2017 15:35   Mr Liver W SW Contrast  Result Date: 09/22/2017 CLINICAL DATA:  Portal vein thrombosis. Liver masses and multiple nodes on prior CT. EXAM: MRI ABDOMEN WITHOUT AND WITH CONTRAST TECHNIQUE: Multiplanar multisequence MR imaging of the abdomen was performed both before and after the administration of intravenous contrast. CONTRAST:  70m MULTIHANCE GADOBENATE DIMEGLUMINE 529 MG/ML IV SOLN COMPARISON:   CT abdomen/ pelvis dated 09/06/2017. FINDINGS: Severely motion degraded images. Lower chest: Small bilateral pleural effusions, left greater than right. Hepatobiliary: Infiltrating hypoenhancing tumor in the central liver (segment 4B), adjacent to the gallbladder fossa, measuring approximately 5.4 x 3.3 cm (series 1201/image 52). This is poorly evaluated due to motion degradation. Primary differential considerations include intrahepatic cholangiocarcinoma versus gallbladder adenocarcinoma, although metastases remain possible. Hepatocellular carcinoma is also technically possible but considered less likely. Associated regional intrahepatic ductal dilatation primarily involving the left hepatic lobe (series 7/ image 19). Gallbladder is notable for layering gallstones with central proteinaceous/ hemorrhagic fluid in the gallbladder lumen (notable for intrinsic T1 hyperintensity. Additional scattered hepatic metastases, including a dominant 2.2 cm lesion in segment 6 (series 1201/ image 47). These are poorly evaluated due to motion degradation and are better visualized on prior CT. Pancreas: Pancreas is grossly unremarkable. No definite mass, pancreatic ductal dilatation, or parenchymal atrophy. Spleen:  Within normal limits. Adrenals/Urinary Tract:  Adrenal glands within normal limits. Kidneys within normal limits.  No hydronephrosis. Stomach/Bowel: Stomach is mildly distended. Visualized bowel is poorly evaluated but grossly unremarkable. Vascular/Lymphatic:  No evidence of abdominal aortic aneurysm. Central portal vein thrombosis, although poorly evaluated due to motion degradation. Upper abdominal/peripancreatic nodes measuring up to 11 mm short axis (series 1202/ image 73), better evaluated on recent CT. Other:  Mild upper abdominal ascites. Musculoskeletal: No focal osseous lesions. IMPRESSION: Severely motion degraded images, limiting evaluation. Dominant 5.4 cm lesion in the central liver, adjacent to the  gallbladder fossa. Primary differential considerations include intrahepatic or gallbladder adenocarcinoma,  with metastases less likely. Additional scattered hepatic metastases, measuring up to 2.1 cm in segment 6, better evaluated on prior CT. Associated portal vein thrombosis, poorly evaluated. Small upper abdominal lymph nodes measuring up to 11 mm short axis, poorly evaluated. Small volume perihepatic ascites. Small bilateral pleural effusions, left greater than right. Electronically Signed   By: Julian Hy M.D.   On: 09/22/2017 09:17   US Renal  Result Date: 09/17/2017 CLINICAL DATA:  Acute kidney injury. EXAM: RENAL / URINARY TRACT ULTRASOUND COMPLETE COMPARISON:  CT abdomen pelvis 09/14/2017 FINDINGS: Right Kidney: Length: 11.4 cm. Echogenicity within normal limits. No mass or hydronephrosis visualized. Left Kidney: Length: 11 cm. Echogenicity within normal limits. No mass or hydronephrosis visualized. Bladder: Appears normal for degree of bladder distention. Prostate gland is enlarged, measuring 4.7 x 3.8 x 5.1 cm. Prostate makes impression on the bladder base. Moderate free fluid in the abdomen consistent with ascites. IMPRESSION: Normal sound appearance of the kidneys. No hydronephrosis. Diffuse ascites. Enlarged prostate gland. Electronically Signed   By: Lucienne Capers M.D.   On: 09/17/2017 21:42   Dg Ercp Biliary & Pancreatic Ducts  Result Date: 09/15/2017 CLINICAL DATA:  Gallstones EXAM: ERCP TECHNIQUE: Multiple spot images obtained with the fluoroscopic device and submitted for interpretation post-procedure. FLUOROSCOPY TIME:  Fluoroscopy Time:  7 minutes and 25 seconds Radiation Exposure Index (if provided by the fluoroscopic device): Number of Acquired Spot Images: 0 COMPARISON:  None. FINDINGS: Images demonstrate cannulation of the common bile duct and contrast filling the biliary tree. A biliary stent has been placed on the final image. Balloon stone retrieval is noted. There is  narrowing of the upper common bile duct of unknown significance. IMPRESSION: See above. These images were submitted for radiologic interpretation only. Please see the procedural report for the amount of contrast and the fluoroscopy time utilized. Electronically Signed   By: Marybelle Killings M.D.   On: 09/15/2017 12:30   Ir Percutaneous Transhepatic Cholangiogram  Result Date: 09/21/2017 INDICATION: 67 year old with liver lesions and elevated bilirubin level. Elevated bilirubin despite placement of endoscopic stent. Evaluate for biliary dilatation and possible placement of external drain. EXAM: PERCUTANEOUS TRANSHEPATIC CHOLANGIOGRAM WITH ULTRASOUND AND FLUOROSCOPIC GUIDANCE MEDICATIONS: Zosyn 3.375 g; The antibiotic was administered within an appropriate time frame prior to the initiation of the procedure. ANESTHESIA/SEDATION: Moderate (conscious) sedation was employed during this procedure. A total of Versed 1.5 mg and Fentanyl 75 mcg was administered intravenously. Moderate Sedation Time: 60 minutes. The patient's level of consciousness and vital signs were monitored continuously by radiology nursing throughout the procedure under my direct supervision. FLUOROSCOPY TIME:  Fluoroscopy Time: 3 minutes 42 seconds (25 mGy). (fluoroscopy time includes placement of PICC line) CONTRAST:  40 mL [id number] COMPLICATIONS: None immediate. PROCEDURE: Informed written consent was obtained from the patient after a thorough discussion of the procedural risks, benefits and alternatives. All questions were addressed. Maximal Sterile Barrier Technique was utilized including caps, mask, sterile gowns, sterile gloves, sterile drape, hand hygiene and skin antiseptic. A timeout was performed prior to the initiation of the procedure. The procedure was performed after placement of right arm PICC line and ultrasound-guided paracentesis. Proximal 1 L of fluid was removed from the perihepatic space. Skin was anesthetized with 1% lidocaine.  [place] needle was directed into the liver at multiple locations using ultrasound guidance. Using ultrasound guidance, needle was directed into what appeared to be dilated bile ducts in the left hepatic lobe but contrast injections suggested that these were actually portal vein  branches. Eventually, non dilated bile duct was cannulated in the right hepatic lobe. Transhepatic cholangiogram was performed. No evidence for biliary obstruction, therefore, biliary drain was not placed. Yueh catheter was directed back in the perihepatic space and some additional ascites was removed. Bandage placed over the puncture site. FINDINGS: Perihepatic ascites. Approximately 1 L of fluid was removed from the perihepatic space and the paracentesis drain was removed during the PTC. During the PTC, the fluid re-accumulated around the liver. Transhepatic cholangiogram demonstrated nondilated intrahepatic bile ducts. However, there is irregularity and narrowing of the central intrahepatic ducts. Contrast filled the endoscopic stent and drained into the duodenum. Ultrasound images demonstrate scattered hepatic lesions including a central hypoechoic lesion. Gallbladder appeared to be mildly dilated containing echogenic material. IMPRESSION: No significant biliary dilatation. Transhepatic cholangiogram demonstrated a patent endoscopic stent. Therefore, an external biliary drain was not placed. Narrowing and irregularity of central intrahepatic bile ducts. Scattered hepatic lesions including a central hypoechoic lesion. Based on these imaging findings and previous CT, findings are highly concerning for a primary biliary or hepatic neoplasm. Differential diagnosis includes cholangiocarcinoma and gallbladder carcinoma. Mildly dilated structures in left hepatic lobe were thought to represent bile ducts but cannulation confirmed portal venous system. Findings are concerning for underlying thrombus within the left portal venous system and  suspect there is thrombus at the portal confluence on the CT from 09/14/2017. Recommend further characterization with MRI or dedicated liver/pancreatic CT. Immediate re-accumulation of perihepatic ascites following the paracentesis. Electronically Signed   By: Markus Daft M.D.   On: 09/21/2017 08:11   Ir Picc Placement Right >5 Yrs Inc Img Guide  Result Date: 09/21/2017 INDICATION: 67 year old with liver lesions and elevated bilirubin. Plan for percutaneous transhepatic cholangiogram but the patient needs additional IV access for the procedure because he requires FFP and antibiotics along with the sedation. Attempts to place additional peripheral IVs were unsuccessful. EXAM: PICC LINE PLACEMENT WITH ULTRASOUND AND FLUOROSCOPIC GUIDANCE MEDICATIONS: None ANESTHESIA/SEDATION: The patient was continuously monitored during the procedure by the interventional radiology nurse under my direct supervision. FLUOROSCOPY TIME:  Fluoroscopy time was combined between the PICC line placement and the transhepatic cholangiogram. See cholangiogram report for fluoroscopy time. COMPLICATIONS: None immediate. PROCEDURE: The patient was advised of the possible risks and complications and agreed to undergo the procedure. The patient was then brought to the angiographic suite for the procedure. The right arm was prepped with chlorhexidine, draped in the usual sterile fashion using maximum barrier technique (cap and mask, sterile gown, sterile gloves, large sterile sheet, hand hygiene and cutaneous antiseptic). Local anesthesia was attained by infiltration with 1% lidocaine. Ultrasound demonstrated patency of the right brachial vein, and this was documented with an image. Under real-time ultrasound guidance, this vein was accessed with a 21 gauge micropuncture needle and image documentation was performed. The needle was exchanged over a guidewire for a peel-away sheath through which a 35 cm 5 [place] dual lumen power injectable PICC was  advanced, and positioned with its tip at the lower SVC/right atrial junction. Fluoroscopy during the procedure and fluoro spot radiograph confirms appropriate catheter position. The catheter was flushed, secured to the skin, and covered with a sterile dressing. IMPRESSION: Successful placement of a right arm PICC with sonographic and fluoroscopic guidance. The catheter is ready for use. Electronically Signed   By: Markus Daft M.D.   On: 09/21/2017 08:14   Ir Paracentesis  Result Date: 09/21/2017 INDICATION: 67 year old with liver lesions and elevated bilirubin. Patient also has perihepatic ascites. Plan  for ultrasound-guided paracentesis and percutaneous transhepatic cholangiogram. EXAM: ULTRASOUND GUIDED PARACENTESIS MEDICATIONS: None. COMPLICATIONS: None immediate. PROCEDURE: Informed written consent was obtained from the patient after a discussion of the risks, benefits and alternatives to treatment. A timeout was performed prior to the initiation of the procedure. Initial ultrasound scanning demonstrates a moderate amount of ascites in the perihepatic space. The right upper abdomen was prepped and draped in the usual sterile fashion. 1% lidocaine with was used for local anesthesia. Following this, a 6 Fr Safe-T-Centesis catheter was introduced. An ultrasound image was saved for documentation purposes. The paracentesis was performed. Catheter was removed and a percutaneous transhepatic cholangiogram was performed. During the cholangiogram, there was re-accumulation of the perihepatic ascites. The Yueh catheter was directed into the perihepatic space and some additional fluid was removed. FINDINGS: A total of approximately 1 L of cloudy yellow/orange fluid was removed. Samples were sent to the laboratory as requested by the clinical team. IMPRESSION: Successful ultrasound-guided paracentesis yielding 1 liter of peritoneal fluid. Electronically Signed   By: Markus Daft M.D.   On: 09/21/2017 08:18     Microbiology: Recent Results (from the past 240 hour(s))  Culture, blood (Routine x 2)     Status: None   Collection Time: 09/14/17 12:55 PM  Result Value Ref Range Status   Specimen Description   Final    BLOOD RIGHT HAND BOTTLES DRAWN AEROBIC AND ANAEROBIC   Special Requests Blood Culture adequate volume  Final   Culture NO GROWTH 5 DAYS  Final   Report Status 09/19/2017 FINAL  Final  Culture, blood (Routine x 2)     Status: None   Collection Time: 09/14/17 12:55 PM  Result Value Ref Range Status   Specimen Description   Final    LEFT ANTECUBITAL BOTTLES DRAWN AEROBIC AND ANAEROBIC   Special Requests   Final    Blood Culture results may not be optimal due to an excessive volume of blood received in culture bottles   Culture NO GROWTH 5 DAYS  Final   Report Status 09/19/2017 FINAL  Final  Culture, Urine     Status: Abnormal   Collection Time: 09/18/17  4:46 AM  Result Value Ref Range Status   Specimen Description URINE, CLEAN CATCH  Final   Special Requests NONE  Final   Culture 80,000 COLONIES/mL YEAST HYPHAL ELEMENTS SEEN (A)  Final   Report Status 09/19/2017 FINAL  Final     Labs: Basic Metabolic Panel: Recent Labs  Lab 09/18/17 0416 09/19/17 0402 09/20/17 0409 09/21/17 0810 09/23/17 0221  NA 131* 131* 132* 133* 136  K 3.7 4.1 4.4 5.3* 3.4*  CL 101 103 104 105 110  CO2 19* 20* 17* 19* 20*  GLUCOSE 149* 166* 162* 167* 165*  BUN '17 19 16 12 8  '$ CREATININE 1.95* 1.33* 0.95 0.83 0.90  CALCIUM 8.9 9.0 8.9 9.1 9.0   Liver Function Tests: Recent Labs  Lab 09/19/17 0402 09/20/17 0409 09/21/17 0810 09/22/17 0316 09/23/17 0221  AST 89* 111* 126* 118* 115*  ALT 50 53 57 54 55  ALKPHOS 314* 339* 361* 368* 344*  BILITOT 20.8* 22.1* 22.8* 22.7* 22.7*  PROT 7.0 7.4 7.5 7.1 6.7  ALBUMIN 2.0* 2.0* 2.2* 2.0* 1.9*   No results for input(s): LIPASE, AMYLASE in the last 168 hours. Recent Labs  Lab 09/19/17 1416 09/20/17 0409  AMMONIA 48* 77*   CBC: Recent Labs   Lab 09/18/17 0416 09/19/17 0402 09/20/17 0409 09/21/17 0810  WBC 7.6 6.5 5.4 6.6  NEUTROABS  --  4.2 3.6  --   HGB 9.9* 9.9* 10.7* 9.8*  HCT 27.7* 28.2* 30.1* 29.0*  MCV 83.2 83.4 83.1 84.1  PLT 129* 162 154 178   Cardiac Enzymes: No results for input(s): CKTOTAL, CKMB, CKMBINDEX, TROPONINI in the last 168 hours. BNP: BNP (last 3 results) No results for input(s): BNP in the last 8760 hours.  ProBNP (last 3 results) No results for input(s): PROBNP in the last 8760 hours.  CBG: Recent Labs  Lab 09/22/17 0745 09/22/17 1218 09/22/17 1713 09/22/17 2117 09/23/17 0749  GLUCAP 134* 172* 186* 153* 138*       Signed:  Nita Sells MD   [facility] 09/23/2017, 10:00 AM

## 2017-09-24 NOTE — Care Management (Signed)
Cassandra from [facility] called . Patient not answering his cell phone. Cassandra asking if any contact numbers on chart. Unable to find any. Cassandra will call PCP.  Magdalen Spatz RN BSN [phone]

## 2017-10-01 ENCOUNTER — Encounter: Payer: Self-pay | Admitting: Oncology

## 2017-10-01 ENCOUNTER — Telehealth: Payer: Self-pay | Admitting: Oncology

## 2017-10-01 ENCOUNTER — Inpatient Hospital Stay: Payer: Medicare Other | Attending: Oncology | Admitting: Oncology

## 2017-10-01 VITALS — BP 113/63 | HR 109 | Temp 98.3°F | Resp 18 | Ht 72.0 in | Wt 213.1 lb

## 2017-10-01 DIAGNOSIS — R1900 Intra-abdominal and pelvic swelling, mass and lump, unspecified site: Secondary | ICD-10-CM

## 2017-10-01 DIAGNOSIS — R52 Pain, unspecified: Secondary | ICD-10-CM

## 2017-10-01 DIAGNOSIS — R978 Other abnormal tumor markers: Secondary | ICD-10-CM | POA: Diagnosis not present

## 2017-10-01 DIAGNOSIS — R188 Other ascites: Secondary | ICD-10-CM

## 2017-10-01 DIAGNOSIS — R17 Unspecified jaundice: Secondary | ICD-10-CM | POA: Diagnosis not present

## 2017-10-01 DIAGNOSIS — C7800 Secondary malignant neoplasm of unspecified lung: Secondary | ICD-10-CM | POA: Diagnosis not present

## 2017-10-01 DIAGNOSIS — K5900 Constipation, unspecified: Secondary | ICD-10-CM

## 2017-10-01 DIAGNOSIS — Z809 Family history of malignant neoplasm, unspecified: Secondary | ICD-10-CM | POA: Diagnosis not present

## 2017-10-01 DIAGNOSIS — R110 Nausea: Secondary | ICD-10-CM

## 2017-10-01 DIAGNOSIS — R630 Anorexia: Secondary | ICD-10-CM

## 2017-10-01 DIAGNOSIS — C787 Secondary malignant neoplasm of liver and intrahepatic bile duct: Secondary | ICD-10-CM

## 2017-10-01 DIAGNOSIS — R634 Abnormal weight loss: Secondary | ICD-10-CM

## 2017-10-01 DIAGNOSIS — C801 Malignant (primary) neoplasm, unspecified: Secondary | ICD-10-CM

## 2017-10-01 MED ORDER — OXYCODONE-ACETAMINOPHEN 5-325 MG PO TABS: 1.0000 | ORAL_TABLET | Freq: Four times a day (QID) | ORAL | 0 refills | Status: AC | PRN

## 2017-10-01 MED ORDER — PROCHLORPERAZINE MALEATE 10 MG PO TABS: 10.0000 mg | ORAL_TABLET | Freq: Four times a day (QID) | ORAL | 0 refills | Status: AC | PRN

## 2017-10-01 MED ORDER — FLUCONAZOLE 100 MG PO TABS: 100.0000 mg | ORAL_TABLET | Freq: Every day | ORAL | 0 refills | Status: AC

## 2017-10-01 MED ORDER — POLYETHYLENE GLYCOL 3350 17 G PO PACK: 17.0000 g | PACK | Freq: Every day | ORAL | 0 refills | Status: AC

## 2017-10-01 NOTE — Progress Notes (Signed)
  Oncology Nurse Navigator Documentation  Navigator Location: [facility] (10/01/17 1510) Referral date to RadOnc/MedOnc: 09/17/17 (10/01/17 1510) )Navigator Encounter Type: Introductory phone call (10/01/17 1510)   Abnormal Finding Date: 09/14/17 (10/01/17 1510) Confirmed Diagnosis Date: 09/15/17 (10/01/17 1510)               Patient Visit Type: MedOnc;Initial (10/01/17 1510)       Interventions: Education (10/01/17 1510)     Education Method: Verbal;Written (10/01/17 1510)   Met with patient and patient's family during initial med/onc appointment. I provided patient's family with my contact information and a booklet from [facility]: [place] : What you need to know about Cancer of The Pancreas. Patient's family encouraged to call with questions or concerns.   Acuity: Level 1 (10/01/17 1510) Acuity Level 1: Initial guidance, education and coordination as needed;Minimal follow up required (10/01/17 1510)       Time Spent with Patient: 15 (10/01/17 1510)

## 2017-10-01 NOTE — Telephone Encounter (Signed)
Gave avs and calendar for January  °
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
## 2017-10-02 ENCOUNTER — Telehealth: Payer: Self-pay

## 2017-10-02 NOTE — Telephone Encounter (Signed)
Spoke with [name] [name] at [facility]. To let her know that Dr. Benay Spice will be attending physician. Voiced understanding.

## 2017-10-10 ENCOUNTER — Telehealth: Payer: Self-pay | Admitting: *Deleted

## 2017-10-15 ENCOUNTER — Ambulatory Visit: Payer: Medicare Other | Admitting: Nurse Practitioner

## 2017-10-19 NOTE — Telephone Encounter (Signed)
Message from [place] with [facility] reporting pt died at 9 today. Will make Dr. Benay Spice aware.

## 2017-10-19 DEATH — deceased

## 2017-11-15 ENCOUNTER — Encounter: Payer: Medicare Other | Admitting: Genetics

## 2017-11-15 ENCOUNTER — Other Ambulatory Visit: Payer: Medicare Other

## 2018-10-07 IMAGING — US IR PARACENTESIS
1 series · 8 of 8 positions shown · non-contrast
Comparison: none

INDICATION: 66-year-old with liver lesions and elevated bilirubin. Patient also
has perihepatic ascites. Plan for ultrasound-guided paracentesis and
percutaneous transhepatic cholangiogram.

[Series 1: ir paracentesis · 8 of 8 slices shown]
[im 1/8]
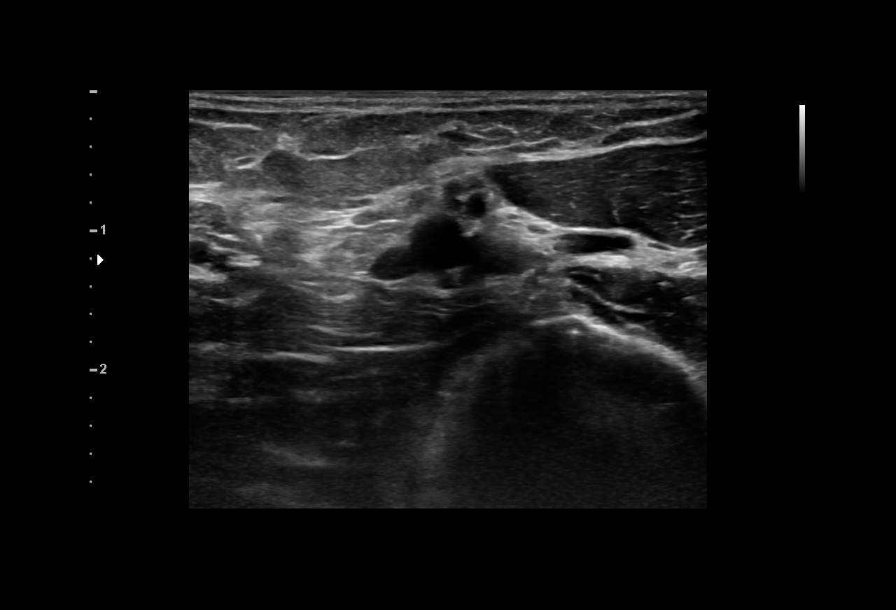
[im 2/8]
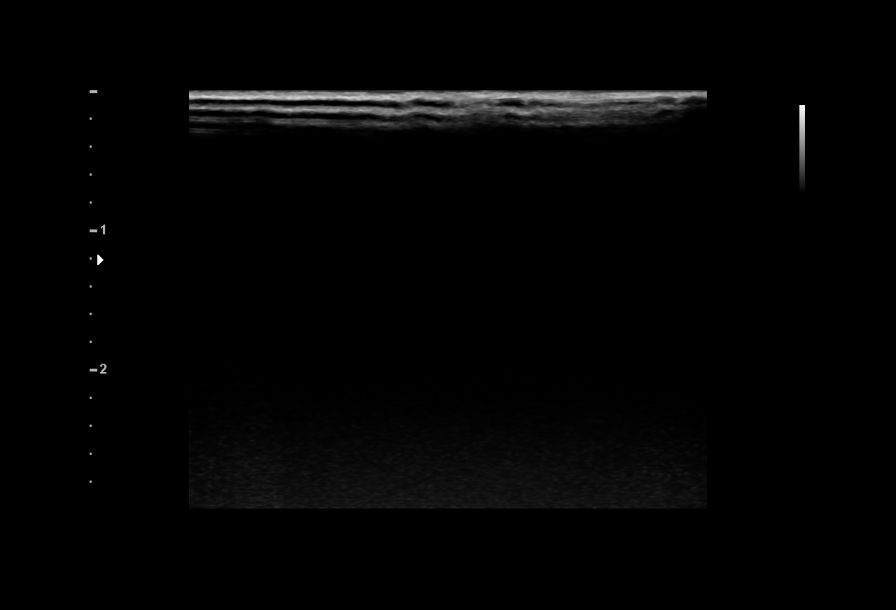
[im 3/8]
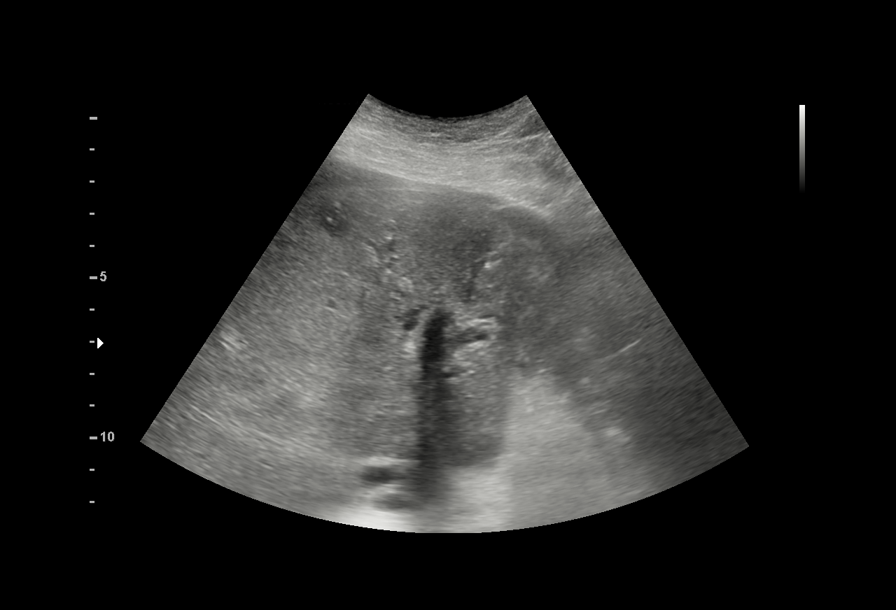
[im 4/8]
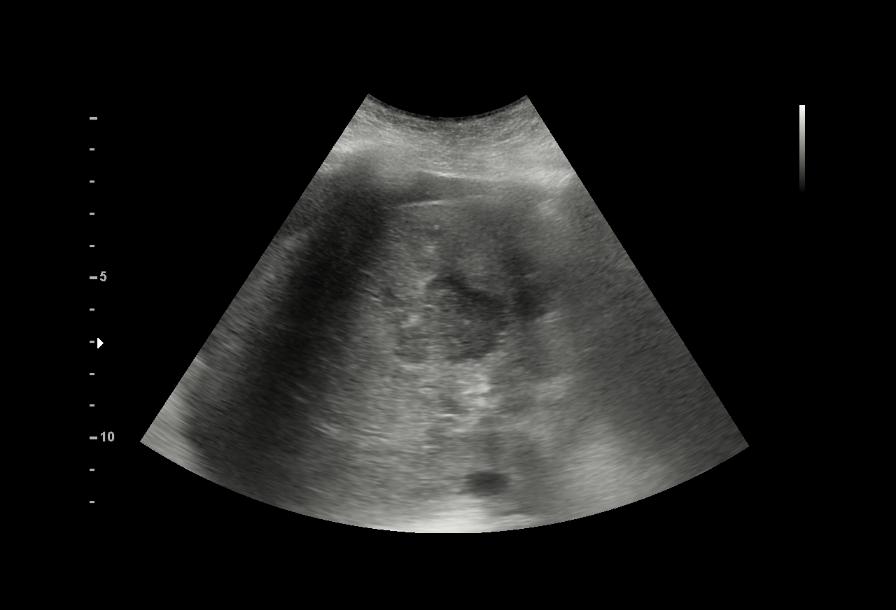
[im 5/8]
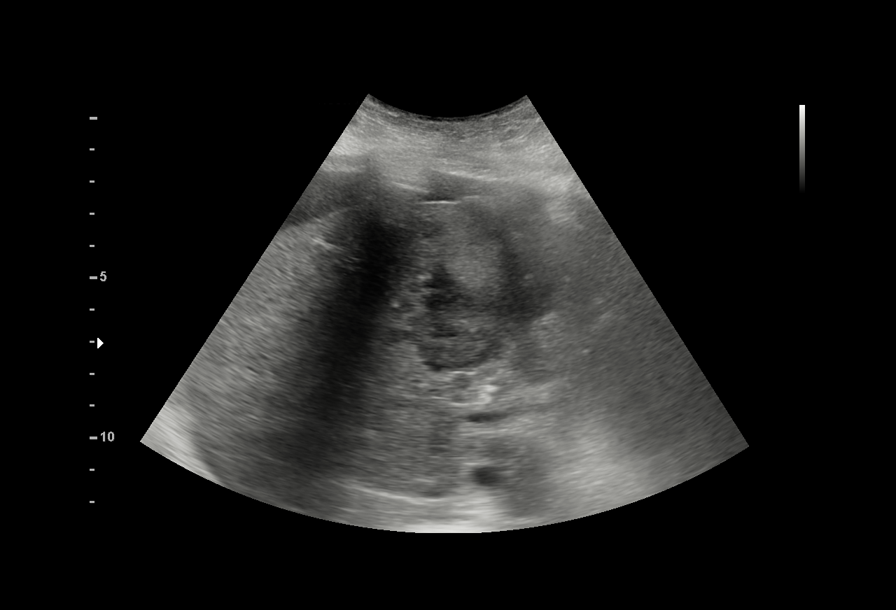
[im 6/8]
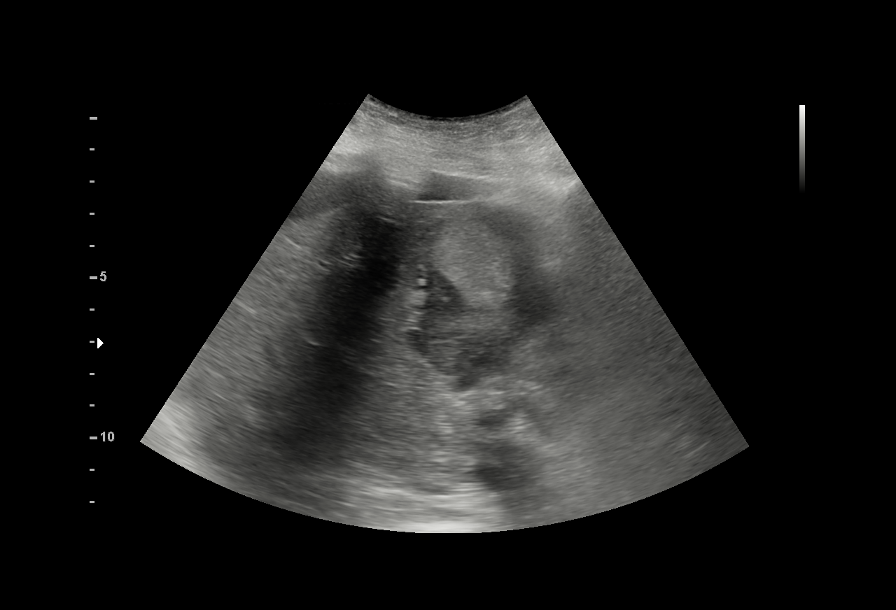
[im 7/8]
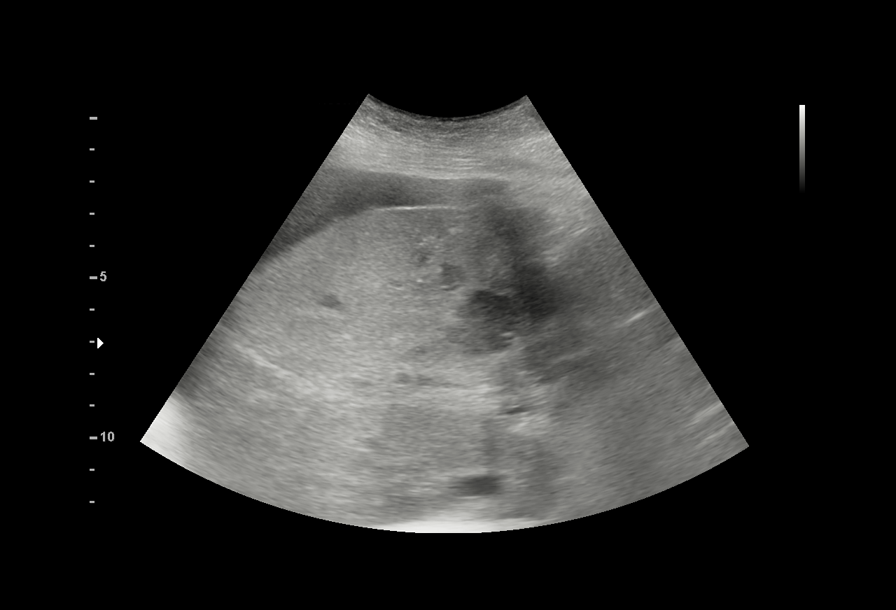
[im 8/8]
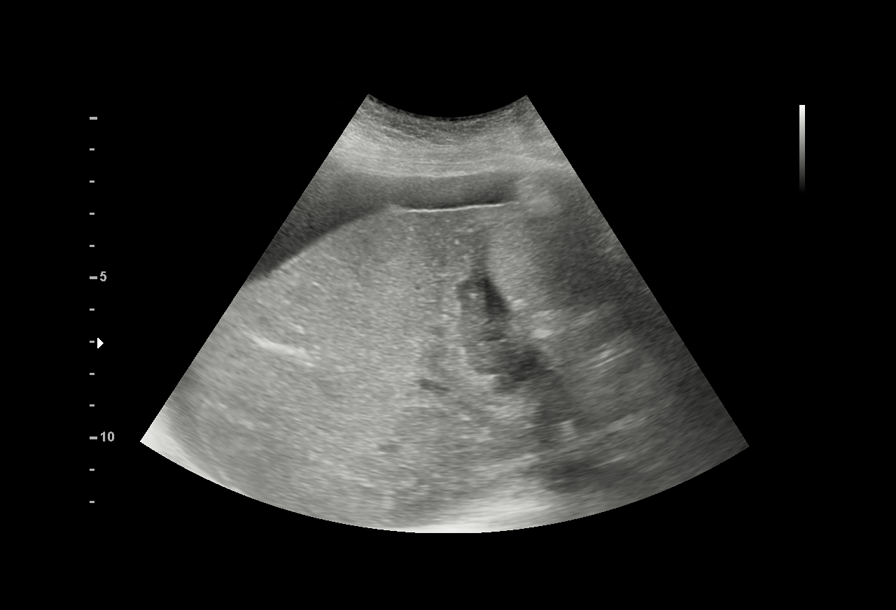

[8 of 8 positions shown; findings below may reference images not displayed]

EXAM:
ULTRASOUND GUIDED PARACENTESIS

MEDICATIONS:
None.

COMPLICATIONS:
None immediate.

PROCEDURE:
Informed written consent was obtained from the patient after a
discussion of the risks, benefits and alternatives to treatment. A
timeout was performed prior to the initiation of the procedure.

Initial ultrasound scanning demonstrates a moderate amount of
ascites in the perihepatic space. The right upper abdomen was
prepped and draped in the usual sterile fashion. 1% lidocaine with
was used for local anesthesia.

Following this, a 6 Fr Safe-T-Centesis catheter was introduced. An
ultrasound image was saved for documentation purposes. The
paracentesis was performed. Catheter was removed and a percutaneous
transhepatic cholangiogram was performed. During the cholangiogram,
there was re-accumulation of the perihepatic ascites. The Yueh
catheter was directed into the perihepatic space and some additional
fluid was removed.
FINDINGS: A total of approximately 1 L of cloudy yellow/[REDACTED] fluid was
removed. Samples were sent to the laboratory as requested by the
clinical team.
IMPRESSION: Successful ultrasound-guided paracentesis yielding 1 liter of
peritoneal fluid.

## 2019-11-23 IMAGING — US US RENAL
1 series · 14 of 25 positions shown · non-contrast
Comparison: CT abdomen pelvis 09/14/2017

CLINICAL DATA: Acute kidney injury.

EXAM:
RENAL / URINARY TRACT ULTRASOUND COMPLETE

[Series 1: us renal · 0.22mm/px · 14 of 33 slices shown]
[im 1/33]
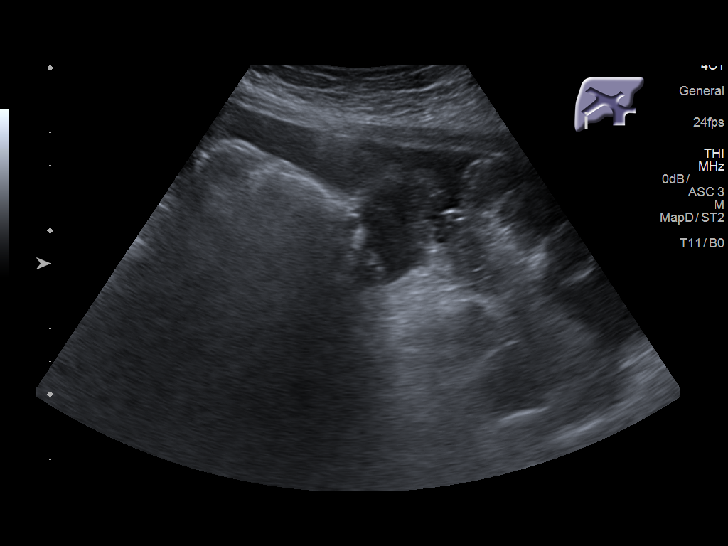
[im 3/33]
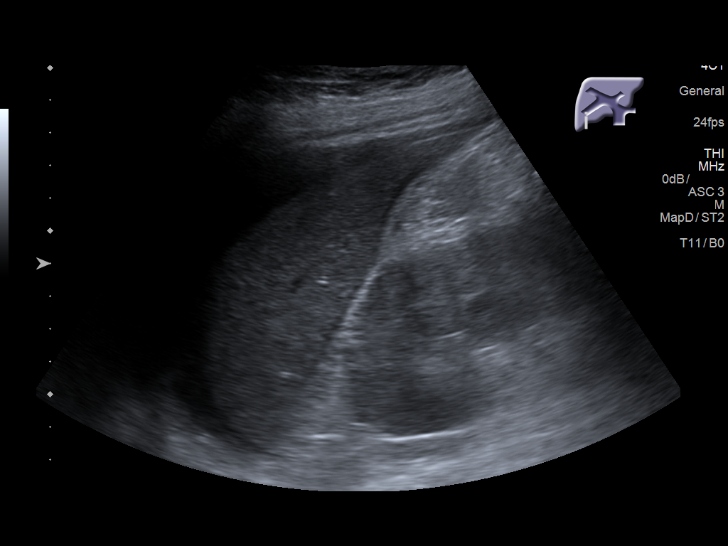
[im 6/33]
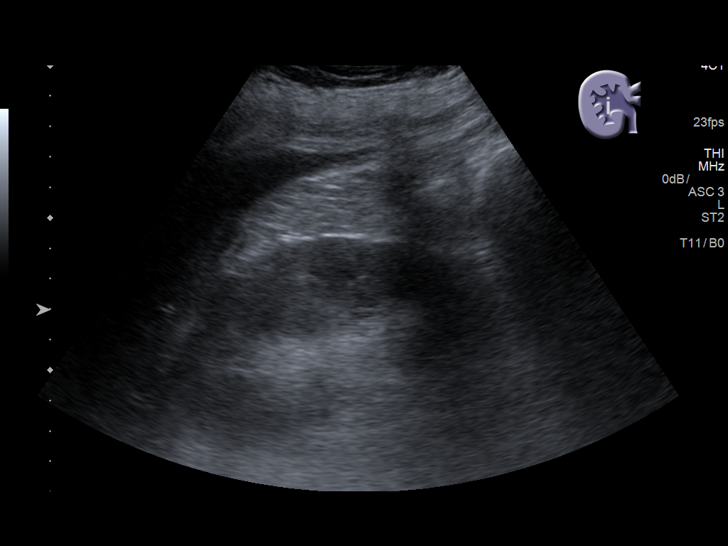
[im 9/33]
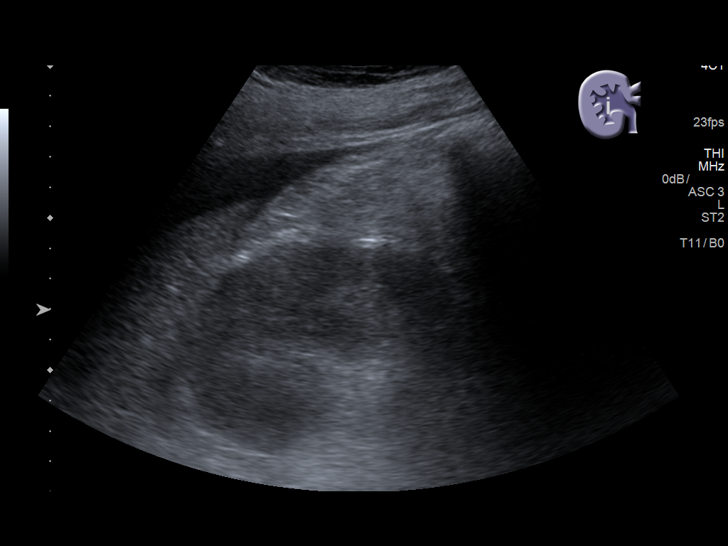
[im 11/33]
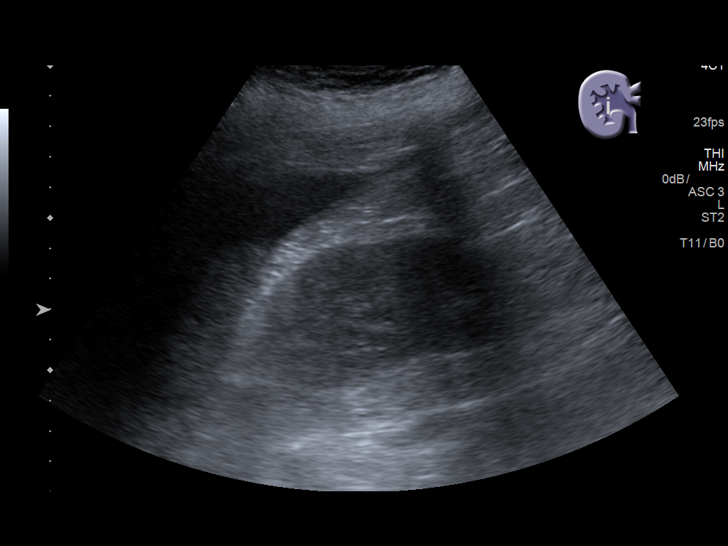
[im 13/33]
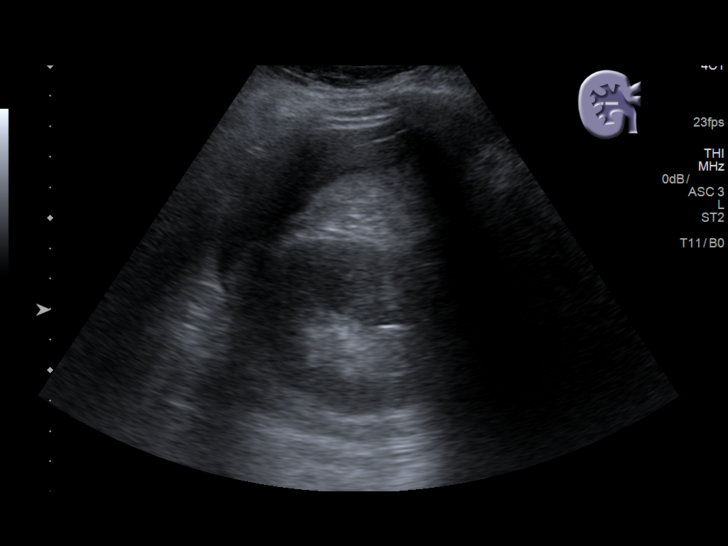
[im 15/33]
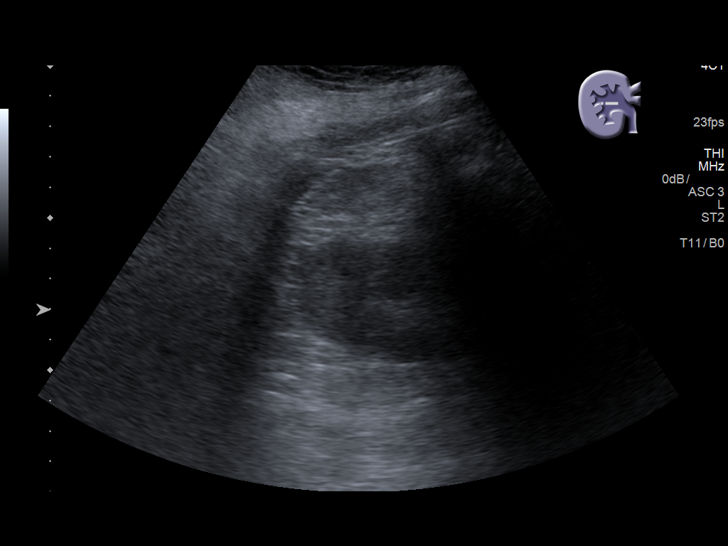
[im 18/33]
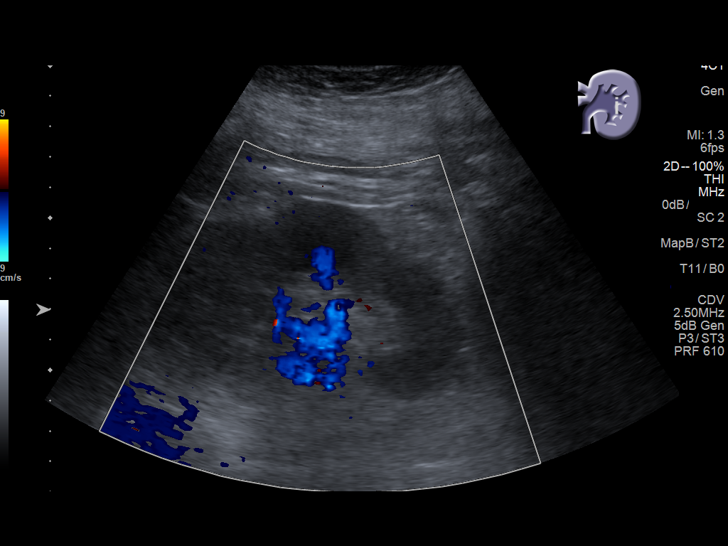
[im 21/33]
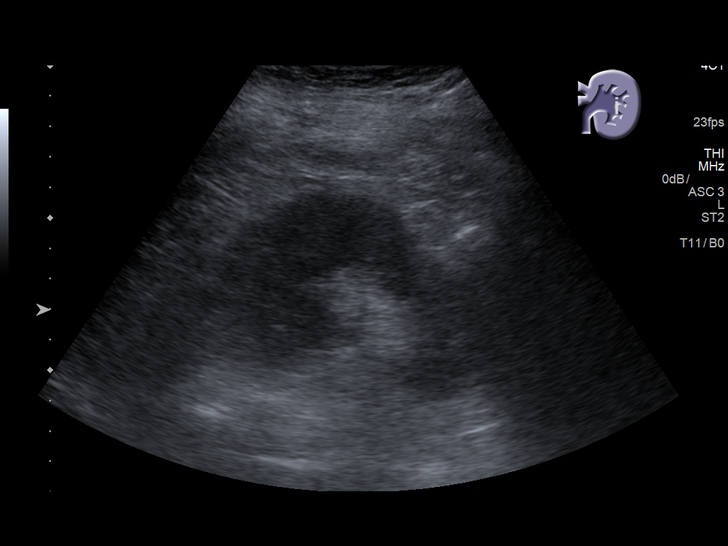
[im 22/33]
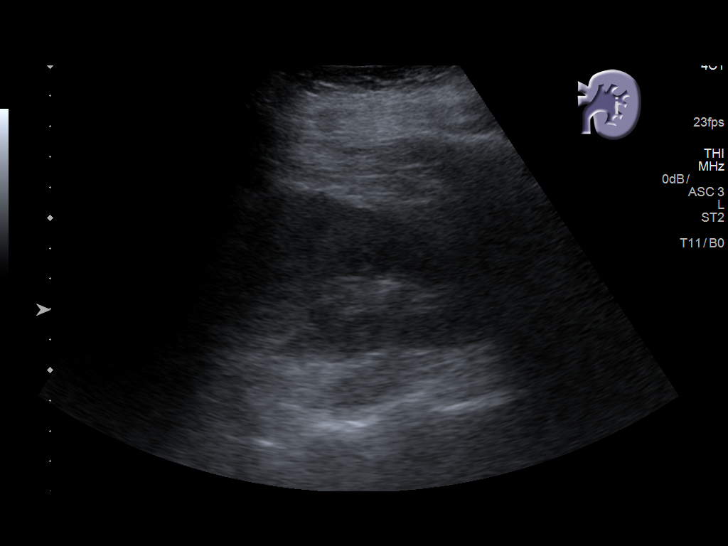
[im 25/33]
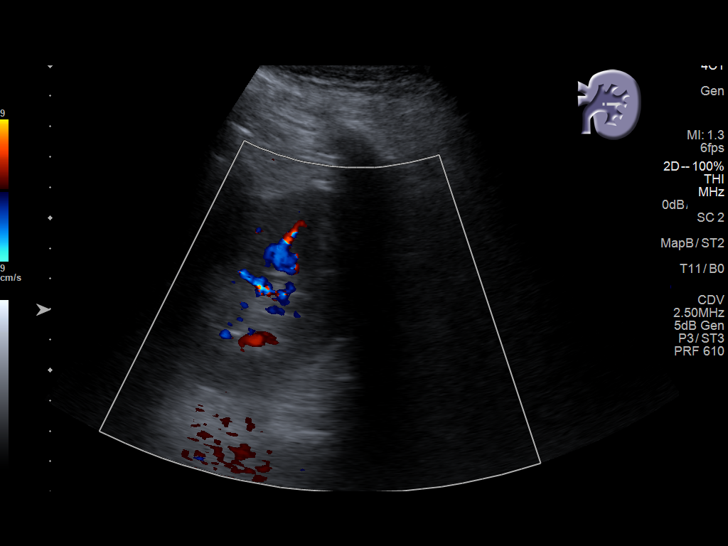
[im 27/33]
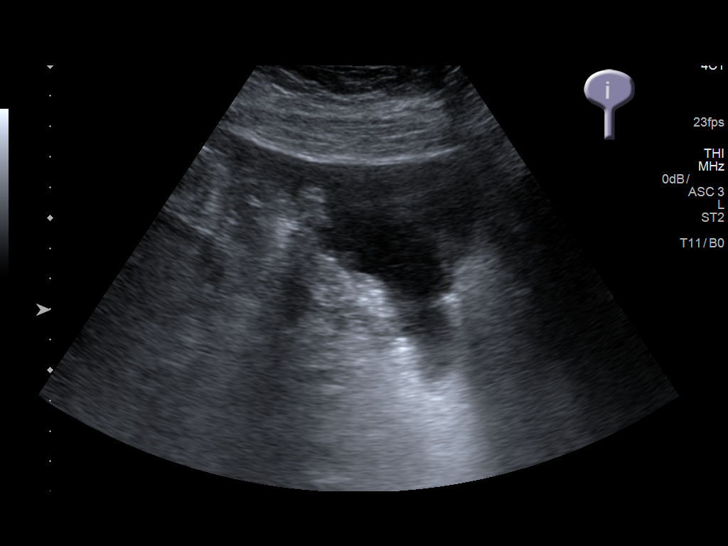
[im 30/33]
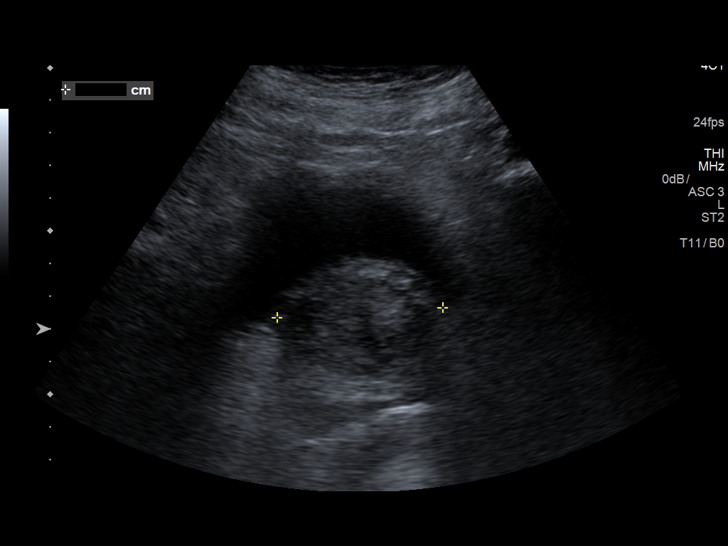
[im 33/33]
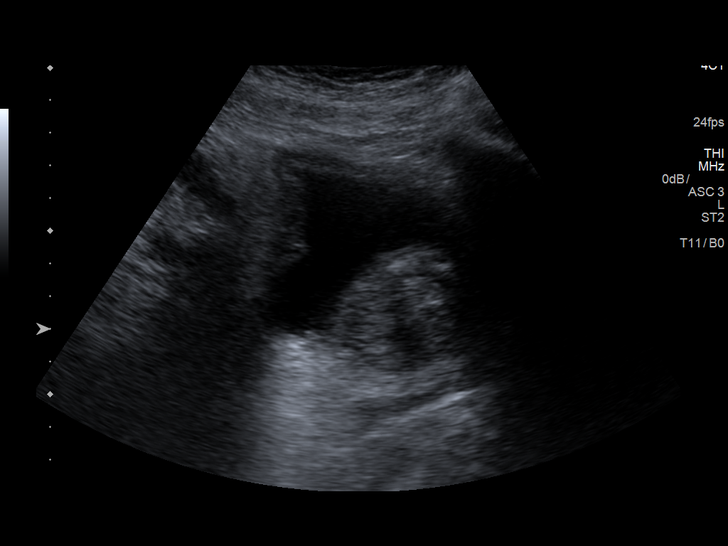

[14 of 25 positions shown; findings below may reference images not displayed]

FINDINGS: Right Kidney:

Length: 11.4 cm. Echogenicity within normal limits. No mass or
hydronephrosis visualized.

Left Kidney:

Length: 11 cm. Echogenicity within normal limits. No mass or
hydronephrosis visualized.

Bladder:

Appears normal for degree of bladder distention.

Prostate gland is enlarged, measuring 4.7 x 3.8 x 5.1 cm. Prostate
makes impression on the bladder base. Moderate free fluid in the
abdomen consistent with ascites.
IMPRESSION: Normal sound appearance of the kidneys. No hydronephrosis. Diffuse
ascites. Enlarged prostate gland.
